# Patient Record
Sex: Female | Born: 1950 | Race: Black or African American | Hispanic: No | Marital: Married | Smoking: Never smoker
Health system: Southern US, Community
[De-identification: ages and names within clinical notes are randomized; demographics above are authoritative.]

## PROBLEM LIST (undated history)

## (undated) DIAGNOSIS — E785 Hyperlipidemia, unspecified: Secondary | ICD-10-CM

## (undated) DIAGNOSIS — M1990 Unspecified osteoarthritis, unspecified site: Secondary | ICD-10-CM

## (undated) DIAGNOSIS — G4733 Obstructive sleep apnea (adult) (pediatric): Secondary | ICD-10-CM

## (undated) DIAGNOSIS — I10 Essential (primary) hypertension: Secondary | ICD-10-CM

## (undated) HISTORY — PX: COLONOSCOPY: SHX174

## (undated) HISTORY — DX: Hyperlipidemia, unspecified: E78.5

## (undated) HISTORY — DX: Essential (primary) hypertension: I10

## (undated) HISTORY — DX: Obstructive sleep apnea (adult) (pediatric): G47.33

---

## 2001-02-09 ENCOUNTER — Encounter: Admission: RE | Admit: 2001-02-09 | Discharge: 2001-02-09 | Payer: Self-pay | Admitting: Gynecology

## 2001-02-09 ENCOUNTER — Encounter: Payer: Self-pay | Admitting: Gynecology

## 2001-10-24 ENCOUNTER — Other Ambulatory Visit: Admission: RE | Admit: 2001-10-24 | Discharge: 2001-10-24 | Payer: Self-pay | Admitting: Gynecology

## 2003-10-08 ENCOUNTER — Other Ambulatory Visit: Admission: RE | Admit: 2003-10-08 | Discharge: 2003-10-08 | Payer: Self-pay | Admitting: Gynecology

## 2005-01-12 ENCOUNTER — Other Ambulatory Visit: Admission: RE | Admit: 2005-01-12 | Discharge: 2005-01-12 | Payer: Self-pay | Admitting: Gynecology

## 2005-03-04 ENCOUNTER — Encounter: Admission: RE | Admit: 2005-03-04 | Discharge: 2005-03-04 | Payer: Self-pay | Admitting: Gynecology

## 2005-04-20 ENCOUNTER — Encounter: Admission: RE | Admit: 2005-04-20 | Discharge: 2005-04-20 | Payer: Self-pay | Admitting: Gynecology

## 2005-09-14 ENCOUNTER — Ambulatory Visit: Payer: Self-pay | Admitting: Pulmonary Disease

## 2005-09-17 ENCOUNTER — Ambulatory Visit (HOSPITAL_BASED_OUTPATIENT_CLINIC_OR_DEPARTMENT_OTHER): Admission: RE | Admit: 2005-09-17 | Discharge: 2005-09-17 | Payer: Self-pay | Admitting: Pulmonary Disease

## 2005-10-06 ENCOUNTER — Ambulatory Visit: Payer: Self-pay | Admitting: Pulmonary Disease

## 2005-10-08 ENCOUNTER — Ambulatory Visit: Payer: Self-pay | Admitting: Pulmonary Disease

## 2005-11-04 ENCOUNTER — Ambulatory Visit: Payer: Self-pay | Admitting: Pulmonary Disease

## 2006-06-20 ENCOUNTER — Other Ambulatory Visit: Admission: RE | Admit: 2006-06-20 | Discharge: 2006-06-20 | Payer: Self-pay | Admitting: Gynecology

## 2006-06-28 ENCOUNTER — Encounter: Admission: RE | Admit: 2006-06-28 | Discharge: 2006-06-28 | Payer: Self-pay | Admitting: Gynecology

## 2008-10-07 ENCOUNTER — Encounter: Admission: RE | Admit: 2008-10-07 | Discharge: 2008-10-07 | Payer: Self-pay | Admitting: Gynecology

## 2010-02-17 ENCOUNTER — Encounter
Admission: RE | Admit: 2010-02-17 | Discharge: 2010-02-17 | Payer: Self-pay | Source: Home / Self Care | Attending: Gynecology | Admitting: Gynecology

## 2010-03-22 ENCOUNTER — Encounter: Payer: Self-pay | Admitting: Gynecology

## 2010-07-17 NOTE — Procedures (Signed)
Isabel Wong, Isabel Wong NO.:  [id number]   MEDICAL RECORD NO.:  [id number]          PATIENT TYPE:  OUT   LOCATION:  SLEEP CENTER                 FACILITY:  [facility]   PHYSICIAN:  Marcelyn Bruins, M.D. [facility] DATE OF BIRTH:  [birth year]   DATE OF STUDY:  09/17/2005                              NOCTURNAL POLYSOMNOGRAM   REFERRING PHYSICIAN:  Dr. Marcelyn Bruins   INDICATIONS FOR THE STUDY:  Hypersomnia with sleep apnea.   EPWORTH SCORE:  9.   SLEEP ARCHITECTURE:  The patient had a total sleep time of only 230 minutes  with decreased REM and never achieved slow wave sleep.  Sleep onset latency  was very prolonged at 112 minutes, and REM onset was very prolonged at 227  minutes.  Sleep efficiency was very decreased at 54%.   RESPIRATORY DATA:  The patient was found to have 145 hypopneas, 62  obstructive apneas, and 3 central apneas for a respiratory disturbance index  of 55 events per hour.  Events were not positional, but there was moderate  snoring noted throughout.   OXYGEN DATA:  There was O2 desaturation as low as 84% with the patient's  obstructive events.   CARDIAC DATA:  No clinically significant cardiac arrhythmias.   MOVEMENT/PARASOMNIA:  None   IMPRESSION/RECOMMENDATIONS:  Severe obstructive sleep apnea/hypopnea  syndrome with a respiratory disturbance index of 55 events per hour and O2  desaturation as low as 84%.  Treatment for this degree of sleep apnea should  consist primarily of weight loss as well as CPAP.           ______________________________  Marcelyn Bruins, M.D. [facility]  Diplomate, American Board of Sleep  Medicine     KC/MEDQ  D:  10/01/2005 15:50:23  T:  10/01/2005 21:03:16  Job:  [id number]

## 2010-07-17 NOTE — Assessment & Plan Note (Signed)
[facility]                               PULMONARY OFFICE NOTE   [name], [name]                         MRN:          [id number]  DATE:11/04/2005                            DOB:          [birth year]    SUBJECTIVE:  Isabel Wong comes in today after initiating a CPAP at the last  visit for severe obstructive sleep apnea.  She is using a full face mask  with her CPAP pressure being 10 cm currently.  She has been wearing it every  night and has seen improvement in her sleep and also in her daytime energy  level.  Overall, she is pleased with her results.  She is slowly getting use  to her CPAP mask.  Of note, her weight is actually decreased 6 pounds since  the last visit.   PHYSICAL EXAMINATION:  GENERAL APPEARANCE:  She is a well-developed black  female in no acute distress.  VITAL SIGNS:  Blood pressure is 106/70, pulse 75, temperature 98, weight 186  pounds.  O2 saturation on room air is 99%.  There is no evidence of skin breakdown or pressure necrosis from the CPAP  mask.   IMPRESSION:  Severe obstructive sleep apnea which is being treated with CPAP  currently.  Patient seems to be doing well and is gradually adjusting to the  machine. At this point in time we need to optimize the pressure for her and  will arrange for an auto titrate device for two weeks with appropriate  download.   PLAN:  1. I have asked the patient to continue working on weight loss.  2. Will do auto titrate device for two weeks and optimize her CPAP      pressure.  3. If everything is going well, patient will follow up in six months or      sooner if there are problems.                                   Isabel Share, MD,FCCP   [facility]/MedQ  DD:  11/04/2005  DT:  11/04/2005  Job #:  [id number]   cc:   Luanna Cole. Lenord Fellers, M.D.

## 2010-07-17 NOTE — Assessment & Plan Note (Signed)
[facility]                               PULMONARY OFFICE NOTE   NEMA, OATLEY                         MRN:          [id number]  DATE:10/08/2005                            DOB:          Aug 08, 1950    SUBJECTIVE:  Isabel Wong comes in today after her recent nocturnal  polysomnogram.  She was found to have severe obstructive sleep apnea with a  respiratory disturbance index of 55 events per hour and O2 desaturation as  low as 84%.  I have gone over the study in great detail with her and have  answered all questions.   PHYSICAL EXAMINATION:  GENERAL:  She is an overweight black female, in no  acute distress.  VITAL SIGNS:  Blood pressure 116/74, pulse 69, temperature 97.9 degrees.  Weight 192 pounds.  Saturation on room air is 97%.   IMPRESSION:  Severe obstructive sleep apnea, documented by nocturnal  polysomnography.  The patient is extremely symptomatic and has been unable  to keep her weight down.   PLAN:  At this point in time her best treatment options are CPAP coupled  with weight loss.  She is agreeable to trying this.  1. Initiate CPAP at 10 cm with humidification and mask of choice.  2. At some point in time the patient will need an auto titrate device at      home, to optimize her pressure for her.  3. Work on weight loss.  4. Follow up in four weeks or sooner if any problems.  The patient knows      to call me if she has difficulties with the CPAP compliance, so that we      can trouble shoot the device prior to her next appointment.                                   Barbaraann Share, MD, FCCP   [facility]/MedQ  DD:  10/08/2005  DT:  10/08/2005  Job #:  [id number]   cc:   Isabel Wong. Lenord Fellers, MD

## 2010-07-17 NOTE — Assessment & Plan Note (Signed)
[facility]                               PULMONARY OFFICE NOTE   Isabel Wong, Isabel Wong                         MRN:          [id number]  DATE:09/14/2005                            DOB:          1950-04-03    SLEEP MEDICINE SELF-REFERRAL   DATE OF EVALUATION:  September 14, 2005.   HISTORY OF PRESENT ILLNESS:  The patient is a 60 year old female, who comes  in today for evaluation of possible sleep apnea.  The patient states that  has been noted to have loud snoring as well as gasping episodes and pauses  in her breathing during sleep.  She typically goes to bed between 10 or 12  at night and gets up between 7:30 and 8:30 in the morning to start the day.  She is tired whenever she arises.  She notes significant excessive daytime  sleepiness with periods of inactivity and will get sleepy with long  distances and on rare occasions at stop lights.  She will fall asleep very  easily watching TV or movies.  She feels that this has had a very large  impact on her quality of life.  Of note, the patient has gained  approximately 20 pounds over the last two years.   PAST MEDICAL HISTORY:  Hypertension, otherwise noncontributory.   CURRENT MEDICATIONS:  Benicar 40/25 daily.   The patient has no known drug allergies.   SOCIAL HISTORY:  She has never smoked, she is married and has children.   FAMILY HISTORY:  Remarkable for heart disease, otherwise is noncontributory.   REVIEW OF SYSTEMS:  As per History of Present Illness.  Also, see patient's  intake form documented in the chart.   PHYSICAL EXAMINATION:  GENERAL:  She is an overweight female in no acute  distress.  VITAL SIGNS:  Blood pressure is 102/70, pulse 94, temperature is 98.4,  weight is 185 pounds, O2 saturation on room air is 99%.  HEENT:  Pupils are equal, round, and reactive to light and accommodation.  Extraocular muscles are intact.  The nares show septal deviation to the left  with near  total obstruction.  The oropharynx shows mild elongation of the  soft palate and uvula.  NECK:  Supple without JVD or lymphadenopathy.  There is no palpable  thyromegaly.  CHEST:  Totally clear.  CARDIAC:  Regular rate and rhythm.  No murmurs, rubs, or gallops.  ABDOMEN:  Soft and nontender with good bowel sounds.  GENITALIA, RECTAL, BREASTS:  Examinations not done and not indicated.  LOWER EXTREMITIES:  Without edema.  Pulses are intact distally.  NEUROLOGIC:  She is alert and oriented with no observable motor defect.   IMPRESSION:  Probable obstructive sleep apnea.  The patient gives a very  good history for this and is clearly very symptomatic during the day.  I had  a long discussion with her about sleep apnea, and feel that a sleep study is  indicated at this time.  The patient is agreeable to this.   PLAN:  1.  Schedule for a nocturnal polysomnogram.  2.  Work on weight loss.  3.  The patient will follow up after the above.                                   Barbaraann Share, MD, FCCP   [facility]/MedQ  DD:  10/03/2005  DT:  10/03/2005  Job #:  [id number]   cc:   Luanna Cole. Lenord Fellers, MD

## 2011-01-14 ENCOUNTER — Other Ambulatory Visit: Payer: Self-pay | Admitting: Gynecology

## 2011-01-14 DIAGNOSIS — Z1231 Encounter for screening mammogram for malignant neoplasm of breast: Secondary | ICD-10-CM

## 2011-02-19 ENCOUNTER — Ambulatory Visit: Payer: Self-pay

## 2012-08-15 ENCOUNTER — Other Ambulatory Visit: Payer: Self-pay

## 2013-09-25 ENCOUNTER — Other Ambulatory Visit: Payer: Self-pay | Admitting: Obstetrics & Gynecology

## 2013-09-25 DIAGNOSIS — N644 Mastodynia: Secondary | ICD-10-CM

## 2013-09-28 ENCOUNTER — Other Ambulatory Visit: Payer: Self-pay

## 2013-10-05 ENCOUNTER — Other Ambulatory Visit: Payer: Self-pay

## 2013-10-10 ENCOUNTER — Other Ambulatory Visit: Payer: Self-pay

## 2014-09-20 ENCOUNTER — Ambulatory Visit
Admission: RE | Admit: 2014-09-20 | Discharge: 2014-09-20 | Disposition: A | Discharge status: Home / Self Care | Payer: BLUE CROSS/BLUE SHIELD | Source: Ambulatory Visit | Attending: Obstetrics & Gynecology | Admitting: Obstetrics & Gynecology

## 2014-09-20 DIAGNOSIS — N644 Mastodynia: Secondary | ICD-10-CM

## 2015-11-19 ENCOUNTER — Other Ambulatory Visit: Payer: Self-pay | Admitting: Obstetrics & Gynecology

## 2015-11-19 DIAGNOSIS — Z1231 Encounter for screening mammogram for malignant neoplasm of breast: Secondary | ICD-10-CM

## 2015-11-27 ENCOUNTER — Ambulatory Visit: Payer: BLUE CROSS/BLUE SHIELD

## 2015-11-28 ENCOUNTER — Ambulatory Visit: Payer: BLUE CROSS/BLUE SHIELD

## 2015-12-09 ENCOUNTER — Ambulatory Visit
Admission: RE | Admit: 2015-12-09 | Discharge: 2015-12-09 | Disposition: A | Discharge status: Home / Self Care | Payer: 59 | Source: Ambulatory Visit | Attending: Obstetrics & Gynecology | Admitting: Obstetrics & Gynecology

## 2015-12-09 DIAGNOSIS — Z1231 Encounter for screening mammogram for malignant neoplasm of breast: Secondary | ICD-10-CM

## 2016-07-07 ENCOUNTER — Other Ambulatory Visit: Payer: Self-pay

## 2016-07-11 NOTE — Progress Notes (Signed)
Cardiology Office Note   Date:  07/13/2016   ID:  Isabel ManeRita D Korver, DOB [birth year], MRN [id number]  PCP:  Deatra JamesSun, Vyvyan, MD  Cardiologist:   Rollene RotundaJames Taneka Espiritu, MD  Referring:  Deatra JamesSun, Vyvyan, MD  Chief Complaint  Patient presents with  . Palpitations  . Shortness of Breath      History of Present Illness: Isabel Wong is a 66 y.o. female who is referred by Deatra JamesSun, Vyvyan, MD for evaluation of difficult to control BPs, palpitations and SOB.  She did have a stress test years ago but otherwise has no past cardiac history. She's had hypertension for 13 years. This has been difficult. He was okay when she was taking branded olemsartan and hydrochlorothiazide. However, when she had to switch to generic she developed facial swelling and hypertension.    She then was switched to generic but she developed rash and facial swelling. She then was switched only hydrochlorothiazide for blood pressure is well-controlled. Sotalol caused facial swelling. She was on chlorthalidone for a while headache. She is now back on Dyazide.  The patient denies any new symptoms such as chest discomfort, neck or arm discomfort. There has been no new PND or orthopnea. There has been no presyncope or syncope.  She does have DOE doing such things as walking a flight of stairs.   She also has had some palpitations while trying different meds.   Past Medical History:  Diagnosis Date  . Hyperlipidemia   . Hypertension   . OSA (obstructive sleep apnea)    CPAP nasal    Past Surgical History:  Procedure Laterality Date  . None       Current Outpatient Prescriptions  Medication Sig Dispense Refill  . CALCIUM-MAGNESIUM-VITAMIN D PO Take 5-10 mLs by mouth daily.    . Flaxseed, Linseed, (FLAX SEEDS) POWD Take by mouth daily.    . Glucosamine HCl (GLUCOSAMINE PO) Take 1 tablet by mouth daily as needed.    Marland Kitchen. guaiFENesin (MUCINEX) 600 MG 12 hr tablet Take 600 mg by mouth 2 (two) times daily as needed.    . MULTIPLE VITAMIN PO  Take 1 tablet by mouth daily.    . Omega-3 Fatty Acids (OMEGA 3 PO) Take 3 capsules by mouth daily.    Marland Kitchen. VIT C-QUERCET-BIOFLV-BROMELAIN PO Take 1 tablet by mouth daily as needed.    Marland Kitchen. spironolactone (ALDACTONE) 25 MG tablet Take 1 tablet (25 mg total) by mouth daily. 30 tablet 3   No current facility-administered medications for this visit.     Allergies:   Chlorthalidone; Benicar [olmesartan]; Hydrochlorothiazide; and Metoprolol    Social History:  The patient  reports that she has never smoked. She has never used smokeless tobacco.   Family History:  The patient's family history includes Breast cancer in her sister; Heart disease in her brother; Hypertension in her father, sister, and sister.   Both parents died young in car accident.     ROS:  Please see the history of present illness.   Otherwise, review of systems are positive for none.   All other systems are reviewed and negative.    PHYSICAL EXAM: VS:  BP 136/90   Pulse 83   Ht 5\' 3"  (1.6 m)   Wt 161 lb 3.2 oz (73.1 kg)   BMI 28.56 kg/m  , BMI Body mass index is 28.56 kg/m. GENERAL:  Well appearing HEENT:  Pupils equal round and reactive, fundi not visualized, oral mucosa unremarkable NECK:  No jugular venous distention,  waveform within normal limits, carotid upstroke brisk and symmetric, no bruits, no thyromegaly LYMPHATICS:  No cervical, inguinal adenopathy LUNGS:  Clear to auscultation bilaterally BACK:  No CVA tenderness CHEST:  Unremarkable HEART:  PMI not displaced or sustained,S1 and S2 within normal limits, no S3, no S4, no clicks, no rubs, no murmurs ABD:  Flat, positive bowel sounds normal in frequency in pitch, no bruits, no rebound, no guarding, no midline pulsatile mass, no hepatomegaly, no splenomegaly EXT:  2 plus pulses throughout, no edema, no cyanosis no clubbing SKIN:  No rashes no nodules NEURO:  Cranial nerves II through XII grossly intact, motor grossly intact throughout PSYCH:  Cognitively intact,  oriented to person place and time    EKG:  EKG is ordered today. The ekg ordered today demonstrates sinus rhythm, rate 90, axis within normal limits, intervals within normal limits, no acute ST-T wave changes   Recent Labs: No results found for requested labs within last 8760 hours.    Lipid Panel No results found for: CHOL, TRIG, HDL, CHOLHDL, VLDL, LDLCALC, LDLDIRECT    Wt Readings from Last 3 Encounters:  07/12/16 161 lb 3.2 oz (73.1 kg)      Other studies Reviewed: Additional studies/ records that were reviewed today include: Labs and office records. Review of the above records demonstrates:  Please see elsewhere in the note.     ASSESSMENT AND PLAN:  HTN:  This has been difficult to control. I am going to suggest that she start spironolactone 25 mg daily and we will check a BMET in 10 days.  She will keep a BP diary.   PALPITATIONS:  She had transient problems with this on one of the meds.  She is not clear which one.  No further imaging is indicated.    Current medicines are reviewed at length with the patient today.  The patient does not have concerns regarding medicines.  The following changes have been made:  As above  Labs/ tests ordered today include:   Orders Placed This Encounter  Procedures  . Basic Metabolic Panel (BMET)  . EKG 12-Lead     Disposition:   FU with me in one month.     Signed, Rollene Rotunda, MD  07/13/2016 11:38 AM    [facility]

## 2016-07-12 ENCOUNTER — Ambulatory Visit (INDEPENDENT_AMBULATORY_CARE_PROVIDER_SITE_OTHER): Payer: 59 | Admitting: Cardiology

## 2016-07-12 ENCOUNTER — Encounter: Payer: Self-pay | Admitting: Cardiology

## 2016-07-12 VITALS — BP 136/90 | HR 83 | Ht 63.0 in | Wt 161.2 lb

## 2016-07-12 DIAGNOSIS — Z79899 Other long term (current) drug therapy: Secondary | ICD-10-CM

## 2016-07-12 DIAGNOSIS — R002 Palpitations: Secondary | ICD-10-CM | POA: Diagnosis not present

## 2016-07-12 DIAGNOSIS — R0602 Shortness of breath: Secondary | ICD-10-CM

## 2016-07-12 DIAGNOSIS — I10 Essential (primary) hypertension: Secondary | ICD-10-CM

## 2016-07-12 MED ORDER — SPIRONOLACTONE 25 MG PO TABS: 25.0000 mg | ORAL_TABLET | Freq: Every day | ORAL | 3 refills | Status: DC

## 2016-07-12 NOTE — Patient Instructions (Addendum)
Medication Instructions:   START taking spironolactone 25mg  DAILY  STOP hydrochlorothiazide (HCTZ)  Labwork:   BMET in 10 days  Testing/Procedures:  none  Follow-Up:  1 month with Dr. Antoine PocheHochrein   If you need a refill on your cardiac medications before your next appointment, please call your pharmacy.

## 2016-07-13 ENCOUNTER — Encounter: Payer: Self-pay | Admitting: Cardiology

## 2016-07-13 DIAGNOSIS — I10 Essential (primary) hypertension: Secondary | ICD-10-CM | POA: Insufficient documentation

## 2016-07-13 DIAGNOSIS — R0602 Shortness of breath: Secondary | ICD-10-CM | POA: Insufficient documentation

## 2016-07-13 DIAGNOSIS — R002 Palpitations: Secondary | ICD-10-CM | POA: Insufficient documentation

## 2016-07-13 HISTORY — DX: Shortness of breath: R06.02

## 2016-07-13 HISTORY — DX: Palpitations: R00.2

## 2016-07-27 ENCOUNTER — Encounter (HOSPITAL_COMMUNITY): Admission: RE | Payer: Self-pay | Source: Ambulatory Visit

## 2016-07-27 ENCOUNTER — Inpatient Hospital Stay (HOSPITAL_COMMUNITY): Admission: RE | Admit: 2016-07-27 | Payer: 59 | Source: Ambulatory Visit | Admitting: Orthopedic Surgery

## 2016-07-27 SURGERY — ARTHROPLASTY, HIP, TOTAL, ANTERIOR APPROACH
Anesthesia: Spinal | Site: Hip | Laterality: Left

## 2016-07-30 LAB — BASIC METABOLIC PANEL
BUN: 10 mg/dL (ref 7–25)
CHLORIDE: 106 mmol/L (ref 98–110)
CO2: 25 mmol/L (ref 20–31)
Calcium: 10.1 mg/dL (ref 8.6–10.4)
Creat: 0.84 mg/dL (ref 0.50–0.99)
GLUCOSE: 78 mg/dL (ref 65–99)
POTASSIUM: 4.5 mmol/L (ref 3.5–5.3)
SODIUM: 141 mmol/L (ref 135–146)

## 2016-08-08 NOTE — Progress Notes (Signed)
Cardiology Office Note   Date:  08/09/2016   ID:  Isabel ManeRita D Goytia, DOB 12/02/1950, MRN [id number]  PCP:  Deatra JamesSun, Vyvyan, MD  Cardiologist:   Rollene RotundaJames Daequan Kozma, MD  Referring:  Deatra JamesSun, Vyvyan, MD  Chief Complaint  Patient presents with  . Hypertension      History of Present Illness: Isabel Wong is a 66 y.o. female who is referred by Deatra JamesSun, Vyvyan, MD for evaluation of difficult to control BPs.  At our first appt I started spironolactone.  Her blood pressures been well controlled.  I reviewed the BP diary.   She's not yet started exercising that she's had some joint problems. She did well with medication. The patient denies any new symptoms such as chest discomfort, neck or arm discomfort. There has been no new shortness of breath, PND or orthopnea. There have been no reported palpitations, presyncope or syncope.   Past Medical History:  Diagnosis Date  . Hyperlipidemia   . Hypertension   . OSA (obstructive sleep apnea)    CPAP nasal    Past Surgical History:  Procedure Laterality Date  . None       Current Outpatient Prescriptions  Medication Sig Dispense Refill  . CALCIUM-MAGNESIUM-VITAMIN D PO Take 5-10 mLs by mouth daily.    . Flaxseed, Linseed, (FLAX SEEDS) POWD Take by mouth daily.    . Glucosamine HCl (GLUCOSAMINE PO) Take 1 tablet by mouth daily as needed.    Marland Kitchen. guaiFENesin (MUCINEX) 600 MG 12 hr tablet Take 600 mg by mouth 2 (two) times daily as needed.    . MULTIPLE VITAMIN PO Take 1 tablet by mouth daily.    . Omega-3 Fatty Acids (OMEGA 3 PO) Take 3 capsules by mouth daily.    Marland Kitchen. spironolactone (ALDACTONE) 25 MG tablet Take 1 tablet (25 mg total) by mouth daily. 30 tablet 3  . VIT C-QUERCET-BIOFLV-BROMELAIN PO Take 1 tablet by mouth daily as needed.     No current facility-administered medications for this visit.     Allergies:   Chlorthalidone; Benicar [olmesartan]; Hydrochlorothiazide; and Metoprolol    ROS:  Please see the history of present illness.   Otherwise,  review of systems are positive for none.   All other systems are reviewed and negative.    PHYSICAL EXAM: VS:  BP 110/74   Pulse (!) 56   Ht 5\' 3"  (1.6 m)   Wt 163 lb (73.9 kg)   BMI 28.87 kg/m  , BMI Body mass index is 28.87 kg/m.  GENERAL:  Well appearing NECK:  No jugular venous distention, waveform within normal limits, carotid upstroke brisk and symmetric, no bruits, no thyromegaly LUNGS:  Clear to auscultation bilaterally BACK:  No CVA tenderness CHEST:  Unremarkable HEART:  PMI not displaced or sustained,S1 and S2 within normal limits, no S3, no S4, no clicks, no rubs, no murmurs ABD:  Flat, positive bowel sounds normal in frequency in pitch, no bruits, no rebound, no guarding, no midline pulsatile mass, no hepatomegaly, no splenomegaly EXT:  2 plus pulses throughout, no edema, no cyanosis no clubbing    EKG:  EKG is not  ordered today.   Recent Labs: 07/29/2016: BUN 10; Creat 0.84; Potassium 4.5; Sodium 141    Lipid Panel No results found for: CHOL, TRIG, HDL, CHOLHDL, VLDL, LDLCALC, LDLDIRECT    Wt Readings from Last 3 Encounters:  08/09/16 163 lb (73.9 kg)  07/12/16 161 lb 3.2 oz (73.1 kg)      Other studies Reviewed: Additional  studies/ records that were reviewed today include: Office labs Review of the above records demonstrates:    ASSESSMENT AND PLAN:  HTN:  The blood pressure is at target. No change in medications is indicated. We will continue with therapeutic lifestyle changes (TLC). She will continue current meds and have potassium followed by her PCP.  The BMET was normal two weeks after the spiro started.   PALPITATIONS:   She is not feeling these  RISK REDUCTION:  Her 10 year cardiovascular risk is 7.4%. Wants to hold off on taking an aspirin. Her LDL was 124 with an HDL of 56. She likely diet for cholesterol. These are all reasonable choices.    Current medicines are reviewed at length with the patient today.  The patient does not have  concerns regarding medicines.  The following changes have been made:  None  Labs/ tests ordered today include:   Non3  No orders of the defined types were placed in this encounter.    Disposition:   FU with me in 12 months.     Signed, Rollene Rotunda, MD  08/09/2016 8:40 AM    [facility]

## 2016-08-09 ENCOUNTER — Encounter (INDEPENDENT_AMBULATORY_CARE_PROVIDER_SITE_OTHER): Payer: Self-pay

## 2016-08-09 ENCOUNTER — Ambulatory Visit (INDEPENDENT_AMBULATORY_CARE_PROVIDER_SITE_OTHER): Payer: 59 | Admitting: Cardiology

## 2016-08-09 ENCOUNTER — Encounter: Payer: Self-pay | Admitting: Cardiology

## 2016-08-09 VITALS — BP 110/74 | HR 56 | Ht 63.0 in | Wt 163.0 lb

## 2016-08-09 DIAGNOSIS — I10 Essential (primary) hypertension: Secondary | ICD-10-CM

## 2016-08-09 DIAGNOSIS — E785 Hyperlipidemia, unspecified: Secondary | ICD-10-CM | POA: Diagnosis not present

## 2016-08-09 NOTE — Patient Instructions (Signed)

## 2016-09-02 ENCOUNTER — Other Ambulatory Visit: Payer: Self-pay

## 2016-09-02 MED ORDER — SPIRONOLACTONE 25 MG PO TABS: 25.0000 mg | ORAL_TABLET | Freq: Every day | ORAL | 3 refills | Status: DC

## 2016-09-02 NOTE — Telephone Encounter (Signed)
Rx(s) sent to pharmacy electronically.  

## 2017-03-14 ENCOUNTER — Telehealth: Payer: Self-pay | Admitting: Cardiology

## 2017-03-14 NOTE — Telephone Encounter (Signed)
Spoke with patient and she has been having left sided chest pain off and on for the last couple of weeks. Pain is worse when she raises her arm or with movement. Can feel tenderness when she presses area. Denies any shortness of breath or radiating pain. Advised patient did not sound cardiac and to follow up with PCP

## 2017-03-14 NOTE — Telephone Encounter (Signed)
Left message to call back  

## 2017-03-14 NOTE — Telephone Encounter (Signed)
Pt c/o of Chest Pain: STAT if CP now or developed within 24 hours  1. Are you having CP right now?  no  2. Are you experiencing any other symptoms (ex. SOB, nausea, vomiting, sweating)? no  3. How long have you been experiencing CP? 2 weeks  4. Is your CP continuous or coming and going? Coming and going   5. Have you taken Nitroglycerin? no ?

## 2017-03-14 NOTE — Telephone Encounter (Signed)
Agree 

## 2017-07-07 ENCOUNTER — Telehealth: Payer: Self-pay

## 2017-07-07 NOTE — Telephone Encounter (Signed)
   [facility] Pre-operative Risk Assessment    Request for surgical clearance:  1. What type of surgery is being performed? Left hip THA w/wo autograft/allograft   2. When is this surgery scheduled? 08/16/17   3. What type of clearance is required (medical clearance vs. Pharmacy clearance to hold med vs. Both)? Medical  4. Are there any medications that need to be held prior to surgery and how long?n/a   5. Practice name and name of physician performing surgery? [facility]   6. What is your office phone number [phone]    7.   What is your office fax number [phone] Derl Barrow  8.   Anesthesia type (None, local, MAC, general) ?  Spinal   Meryl Crutch 07/07/2017, 5:17 PM  _________________________________________________________________   (provider comments below)

## 2017-07-08 NOTE — Telephone Encounter (Signed)
   Primary Cardiologist:James Hochrein, MD  Chart reviewed as part of pre-operative protocol coverage. Because of Isabel Wong's past medical history and time since last visit, he/she will require a follow-up visit in order to better assess preoperative cardiovascular risk. Last OV 08/09/16 -> will be overdue for 1 year follow-up by the time surgery is scheduled. Please arrange f/u appt closer to time of surgery, such as early June - can be with APP.  Pre-op covering staff: - Please schedule appointment and call patient to inform them. - Please contact requesting surgeon's office via preferred method (i.e, phone, fax) to inform them of need for appointment prior to surgery.  Laurann Montana, PA-C  07/08/2017, 4:13 PM

## 2017-07-08 NOTE — Telephone Encounter (Signed)
Left message to call back to schedule OV for clearance. Left message for Isabel Wong at [facility] updating that patient will need OV for clearance

## 2017-07-11 NOTE — Telephone Encounter (Signed)
Left detailed message for pt to call the office and get an appt before her surgical clearance could be completed.

## 2017-07-12 NOTE — Telephone Encounter (Signed)
Pt called back and has been scheduled for her Pre Op clearance appt with Dr. Antoine Poche. Pt thanked me for the call .

## 2017-07-12 NOTE — Telephone Encounter (Signed)
Lmtcb to schedule appt for surgery clearance .

## 2017-07-12 NOTE — Telephone Encounter (Signed)
3rd attempt.. I will take out of the call back pool. I called and lmom for the surgeons office to inform we have attempted to reach the pt x 3 to schedule appt for pre op clearance.

## 2017-07-13 ENCOUNTER — Encounter: Payer: Self-pay | Admitting: Cardiology

## 2017-07-14 NOTE — Progress Notes (Signed)
Cardiology Office Note   Date:  07/15/2017   ID:  Isabel Wong, DOB 04/20/1950, MRN [id number]  PCP:  Deatra Babatunde Seago, MD  Cardiologist:   Rollene Rotunda, MD  Referring:  Deatra Chayanne Filippi, MD  Chief Complaint  Patient presents with  . Follow-up      History of Present Illness: Isabel Wong is a 67 y.o. female who is referred by Deatra Brevon Dewald, MD for evaluation of difficult to control BPs.   Since I last saw her she is done well from a cardiovascular standpoint.  Blood pressures have been very well controlled.  She denies any cardiovascular symptoms.  She is limited by left hip pain and needs left hip replacement. The patient denies any new symptoms such as chest discomfort, neck or arm discomfort. There has been no new shortness of breath, PND or orthopnea. There have been no reported palpitations, presyncope or syncope.   She can still climb stairs and do her household chores.  However, she has to hold onto a cart to get around the grocery store.    At our first appt I started spironolactone.  Her blood pressures been well controlled.  I reviewed the BP diary.   She's not yet started exercising that she's had some joint problems. She did well with medication. The patient denies any new symptoms such as chest discomfort, neck or arm discomfort. There has been no new shortness of breath, PND or orthopnea. There have been no reported palpitations, presyncope or syncope.   Past Medical History:  Diagnosis Date  . Hyperlipidemia   . Hypertension   . OSA (obstructive sleep apnea)    CPAP nasal    Past Surgical History:  Procedure Laterality Date  . None       Current Outpatient Medications  Medication Sig Dispense Refill  . CALCIUM-MAGNESIUM-VITAMIN D PO Take 5-10 mLs by mouth daily.    . Flaxseed, Linseed, (FLAX SEEDS) POWD Take by mouth daily.    . Glucosamine HCl (GLUCOSAMINE PO) Take 1 tablet by mouth daily as needed.    Marland Kitchen guaiFENesin (MUCINEX) 600 MG 12 hr tablet Take 600 mg by  mouth 2 (two) times daily as needed.    . MULTIPLE VITAMIN PO Take 1 tablet by mouth daily.    . Omega-3 Fatty Acids (OMEGA 3 PO) Take 3 capsules by mouth daily.    Marland Kitchen VIT C-QUERCET-BIOFLV-BROMELAIN PO Take 1 tablet by mouth daily as needed.    Marland Kitchen spironolactone (ALDACTONE) 25 MG tablet Take 1 tablet (25 mg total) by mouth daily. 90 tablet 3   No current facility-administered medications for this visit.     Allergies:   Chlorthalidone; Benicar [olmesartan]; Hydrochlorothiazide; and Metoprolol    ROS:  Please see the history of present illness.   Otherwise, review of systems are positive for none.   All other systems are reviewed and negative.    PHYSICAL EXAM: VS:  BP 122/84 (BP Location: Left Arm, Patient Position: Sitting, Cuff Size: Normal)   Pulse 84   Ht  (1.6 m)   Wt 172 lb (78 kg)   BMI 30.47 kg/m  , BMI Body mass index is 30.47 kg/m.  GENERAL:  Well appearing NECK:  No jugular venous distention, waveform within normal limits, carotid upstroke brisk and symmetric, no bruits, no thyromegaly LUNGS:  Clear to auscultation bilaterally CHEST:  Unremarkable HEART:  PMI not displaced or sustained,S1 and S2 within normal limits, no S3, no S4, no clicks, no rubs, no murmurs  ABD:  Flat, positive bowel sounds normal in frequency in pitch, no bruits, no rebound, no guarding, no midline pulsatile mass, no hepatomegaly, no splenomegaly EXT:  2 plus pulses throughout, no edema, no cyanosis no clubbing   EKG:  EKG is not ordered today.   Recent Labs: 07/29/2016: BUN 10; Creat 0.84; Potassium 4.5; Sodium 141    Lipid Panel No results found for: CHOL, TRIG, HDL, CHOLHDL, VLDL, LDLCALC, LDLDIRECT    Wt Readings from Last 3 Encounters:  07/15/17 172 lb (78 kg)  08/09/16 163 lb (73.9 kg)  07/12/16 161 lb 3.2 oz (73.1 kg)      Other studies Reviewed: Additional studies/ records that were reviewed today include: None Review of the above records demonstrates:     ASSESSMENT  AND PLAN:  HTN:  The blood pressure is at target.  I will follow up labs to include CBC, BMET and lipids.  No change in therapy.   RISK REDUCTION:    I will check a lipid profile.  Previously her 10 year cardiovascular risk is 7.4%.  She is not on statin.  One was not indicated.  We will follow up this year.   PREOP:     The patient is at acceptable risk for the planned procedure.  There are no high risk findings.  It is not a high risk procedure.  No further testing is indicated according to ACC/AHA guidelines.    Current medicines are reviewed at length with the patient today.  The patient does not have concerns regarding medicines.  The following changes have been made:  None  Labs/ tests ordered today include:     Orders Placed This Encounter  Procedures  . Comprehensive metabolic panel  . Lipid panel  . CBC  . EKG 12-Lead     Disposition:   FU with me in 12 months.     Signed, Rollene Rotunda, MD  07/15/2017 5:15 PM    [facility]

## 2017-07-15 ENCOUNTER — Encounter: Payer: Self-pay | Admitting: Cardiology

## 2017-07-15 ENCOUNTER — Ambulatory Visit: Payer: 59 | Admitting: Cardiology

## 2017-07-15 VITALS — BP 122/84 | HR 84 | Ht 63.0 in | Wt 172.0 lb

## 2017-07-15 DIAGNOSIS — I10 Essential (primary) hypertension: Secondary | ICD-10-CM

## 2017-07-15 DIAGNOSIS — Z01810 Encounter for preprocedural cardiovascular examination: Secondary | ICD-10-CM

## 2017-07-15 DIAGNOSIS — Z13220 Encounter for screening for lipoid disorders: Secondary | ICD-10-CM

## 2017-07-15 DIAGNOSIS — Z79899 Other long term (current) drug therapy: Secondary | ICD-10-CM | POA: Diagnosis not present

## 2017-07-15 NOTE — Patient Instructions (Signed)
Dr Antoine Poche recommends that you continue on your current medications as directed. Please refer to the Current Medication list given to you today.  Your physician recommends that you return for lab work at your earliest convenience - FASTING.  Dr Royann Shivers recommends that you schedule a follow-up appointment in 12 months. You will receive a reminder letter in the mail two months in advance. If you don't receive a letter, please call our office to schedule the follow-up appointment.  If you need a refill on your cardiac medications before your next appointment, please call your pharmacy.

## 2017-07-16 ENCOUNTER — Encounter: Payer: Self-pay | Admitting: Cardiology

## 2017-07-20 LAB — CBC
HEMATOCRIT: 39.3 % (ref 34.0–46.6)
Hemoglobin: 12.8 g/dL (ref 11.1–15.9)
MCH: 30 pg (ref 26.6–33.0)
MCHC: 32.6 g/dL (ref 31.5–35.7)
MCV: 92 fL (ref 79–97)
Platelets: 226 10*3/uL (ref 150–450)
RBC: 4.26 x10E6/uL (ref 3.77–5.28)
RDW: 14.4 % (ref 12.3–15.4)
WBC: 5 10*3/uL (ref 3.4–10.8)

## 2017-07-20 LAB — COMPREHENSIVE METABOLIC PANEL
A/G RATIO: 1.7 (ref 1.2–2.2)
ALK PHOS: 66 IU/L (ref 39–117)
ALT: 15 IU/L (ref 0–32)
AST: 16 IU/L (ref 0–40)
Albumin: 4.5 g/dL (ref 3.6–4.8)
BILIRUBIN TOTAL: 0.5 mg/dL (ref 0.0–1.2)
BUN/Creatinine Ratio: 14 (ref 12–28)
BUN: 10 mg/dL (ref 8–27)
CHLORIDE: 106 mmol/L (ref 96–106)
CO2: 23 mmol/L (ref 20–29)
Calcium: 10.1 mg/dL (ref 8.7–10.3)
Creatinine, Ser: 0.73 mg/dL (ref 0.57–1.00)
GFR calc Af Amer: 100 mL/min/{1.73_m2} (ref >59)
GFR calc non Af Amer: 87 mL/min/{1.73_m2} (ref >59)
GLOBULIN, TOTAL: 2.7 g/dL (ref 1.5–4.5)
Glucose: 89 mg/dL (ref 65–99)
POTASSIUM: 5 mmol/L (ref 3.5–5.2)
SODIUM: 142 mmol/L (ref 134–144)
Total Protein: 7.2 g/dL (ref 6.0–8.5)

## 2017-07-20 LAB — LIPID PANEL
Chol/HDL Ratio: 3.3 ratio (ref 0.0–4.4)
Cholesterol, Total: 177 mg/dL (ref 100–199)
HDL: 53 mg/dL (ref >39)
LDL CALC: 113 mg/dL — AB (ref 0–99)
TRIGLYCERIDES: 54 mg/dL (ref 0–149)
VLDL Cholesterol Cal: 11 mg/dL (ref 5–40)

## 2017-07-21 ENCOUNTER — Telehealth: Payer: Self-pay | Admitting: *Deleted

## 2017-07-21 DIAGNOSIS — E785 Hyperlipidemia, unspecified: Secondary | ICD-10-CM

## 2017-07-21 NOTE — Telephone Encounter (Signed)
-----   Message from Rollene Rotunda, MD sent at 07/21/2017  2:34 PM EDT ----- LDL is slightly elevated.   HDL is OK.   She could take a statin but also could try to improve her diet and repeat Lipid in six weeks. Call Ms. Pittinger with the results and send results to Deatra James, MD

## 2017-07-21 NOTE — Telephone Encounter (Signed)
Pt aware of her blood work, lab slip printed and mail to pt, pt is will to try dieting and exercise before medication

## 2017-08-08 NOTE — H&P (Signed)
TOTAL HIP ADMISSION H&P  Patient is admitted for left total hip arthroplasty, anterior approach.  Subjective:  Chief Complaint:     Left hip primary OA / pain  HPI: Isabel Wong, 67 y.o. female, has a history of pain and functional disability in the left hip(s) due to arthritis and patient has failed non-surgical conservative treatments for greater than 12 weeks to include NSAID's and/or analgesics and activity modification.  Onset of symptoms was gradual starting ~3 years ago with gradually worsening course since that time.The patient noted no past surgery on the left hip(s).  Patient currently rates pain in the left hip at 10 out of 10 with activity. Patient has night pain, worsening of pain with activity and weight bearing, trendelenberg gait, pain that interfers with activities of daily living and pain with passive range of motion. Patient has evidence of periarticular osteophytes and joint space narrowing by imaging studies. This condition presents safety issues increasing the risk of falls.  There is no current active infection.  Risks, benefits and expectations were discussed with the patient.  Risks including but not limited to the risk of anesthesia, blood clots, nerve damage, blood vessel damage, failure of the prosthesis, infection and up to and including death.  Patient understand the risks, benefits and expectations and wishes to proceed with surgery.   PCP: Deatra JamesSun, Vyvyan, MD  D/C Plans:       Home  Post-op Meds:       No Rx given  Tranexamic Acid:      To be given - IV   Decadron:      Is to be given  FYI:      ASA  Norco  CPAP  DME:   Rx given for - RW   PT:   OPPT Rx given   Patient Active Problem List   Diagnosis Date Noted  . Hypertension 07/13/2016  . Palpitations 07/13/2016  . SOB (shortness of breath) 07/13/2016   Past Medical History:  Diagnosis Date  . Hyperlipidemia   . Hypertension   . OSA (obstructive sleep apnea)    CPAP nasal    Past Surgical  History:  Procedure Laterality Date  . None      No current facility-administered medications for this encounter.    Current Outpatient Medications  Medication Sig Dispense Refill Last Dose  . CALCIUM-MAGNESIUM-VITAMIN D PO Take 5 mLs by mouth daily.    Taking  . Flaxseed, Linseed, (FLAX SEEDS) POWD Take 1 tablet by mouth daily.    Taking  . Glucosamine HCl (GLUCOSAMINE PO) Take 1 tablet by mouth daily as needed (for joints).    Taking  . guaiFENesin (MUCINEX) 600 MG 12 hr tablet Take 600 mg by mouth 2 (two) times daily as needed for cough or to loosen phlegm.    Taking  . MULTIPLE VITAMIN PO Take 1 tablet by mouth daily.   Taking  . Omega-3 Fatty Acids (OMEGA 3 PO) Take 3 capsules by mouth daily.   Taking  . spironolactone (ALDACTONE) 25 MG tablet Take 1 tablet (25 mg total) by mouth daily. 90 tablet 3   . VIT C-QUERCET-BIOFLV-BROMELAIN PO Take 1 tablet by mouth daily.    Taking   Allergies  Allergen Reactions  . Chlorthalidone Other (See Comments)    Ineffective at controlling blood pressure, headaches, and ringing in head  . Benicar [Olmesartan] Swelling  . Hydrochlorothiazide Other (See Comments)    BUZZING IN MY HEAD  . Metoprolol Swelling    Social  History   Tobacco Use  . Smoking status: Never Smoker  . Smokeless tobacco: Never Used  Substance Use Topics  . Alcohol use: Never    Frequency: Never    Family History  Problem Relation Age of Onset  . Hypertension Father        Died car accident with wife age 79  . Breast cancer Sister   . Heart disease Brother        Takes a blood thinner  . Hypertension Sister   . Hypertension Sister      Review of Systems  Constitutional: Negative.   HENT: Negative.   Eyes: Negative.   Respiratory: Positive for shortness of breath (with exertion).   Cardiovascular: Negative.   Gastrointestinal: Negative.   Genitourinary: Negative.   Musculoskeletal: Positive for joint pain.  Skin: Negative.   Neurological: Negative.    Endo/Heme/Allergies: Negative.   Psychiatric/Behavioral: Negative.     Objective:  Physical Exam  Constitutional: She is oriented to person, place, and time. She appears well-developed.  HENT:  Head: Normocephalic.  Eyes: Pupils are equal, round, and reactive to light.  Neck: Neck supple. No JVD present. No tracheal deviation present. No thyromegaly present.  Cardiovascular: Normal rate, regular rhythm and intact distal pulses.  Respiratory: Effort normal and breath sounds normal. No respiratory distress. She has no wheezes.  GI: Soft. There is no tenderness. There is no guarding.  Musculoskeletal:       Left hip: She exhibits decreased range of motion, decreased strength, tenderness and bony tenderness. She exhibits no swelling, no deformity and no laceration.  Lymphadenopathy:    She has no cervical adenopathy.  Neurological: She is alert and oriented to person, place, and time.  Skin: Skin is warm and dry.  Psychiatric: She has a normal mood and affect.      Labs:  Estimated body mass index is 30.47 kg/m as calculated from the following:   Height as of 07/15/17: 5\' 3"  (1.6 m).   Weight as of 07/15/17: 78 kg (172 lb).   Imaging Review Plain radiographs demonstrate severe degenerative joint disease of the left hip(s). The bone quality appears to be good for age and reported activity level.    Preoperative templating of the joint replacement has been completed, documented, and submitted to the Operating Room personnel in order to optimize intra-operative equipment management.     Assessment/Plan:  End stage arthritis, left hip  The patient history, physical examination, clinical judgement of the provider and imaging studies are consistent with end stage degenerative joint disease of the left hip and total hip arthroplasty is deemed medically necessary. The treatment options including medical management, injection therapy, arthroscopy and arthroplasty were discussed at  length. The risks and benefits of total hip arthroplasty were presented and reviewed. The risks due to aseptic loosening, infection, stiffness, dislocation/subluxation,  thromboembolic complications and other imponderables were discussed.  The patient acknowledged the explanation, agreed to proceed with the plan and consent was signed. Patient is being admitted for inpatient treatment for surgery, pain control, PT, OT, prophylactic antibiotics, VTE prophylaxis, progressive ambulation and ADL's and discharge planning.The patient is planning to be discharged home.    Anastasio Auerbach Tobin Witucki   PA-C  08/08/2017, 1:45 PM

## 2017-08-10 ENCOUNTER — Encounter (HOSPITAL_COMMUNITY): Payer: Self-pay

## 2017-08-10 NOTE — Patient Instructions (Addendum)
Your procedure is scheduled on: Tuesday, August 16, 2017   Surgery Time:  8:40AM-9:50AM   [facility]    Report to admitting at 6:10 AM   Call this number if you have problems the morning of surgery [phone]   Bring CPAP mask and tubing   Do not eat food or drink liquids :After Midnight.   Do NOT smoke after Midnight   Take these medicines the morning of surgery with A SIP OF WATER: None                               You may not have any metal on your body including hair pins, jewelry, and body piercings             Do not wear make-up, lotions, powders, perfumes/cologne, or deodorant             Do not wear nail polish.  Do not shave  48 hours prior to surgery.               Do not bring valuables to the hospital. [place] IS NOT             RESPONSIBLE   FOR VALUABLES.   Contacts, dentures or bridgework may not be worn into surgery.   Leave suitcase in the car. After surgery it may be brought to your room.   Special Instructions: Bring a copy of your healthcare power of attorney and living will documents         the day of surgery if you haven't scanned them in before.              Please read over the following fact sheets you were given:  [facility] Pre[facility] Before surgery, you can play an important role.  Because skin is not sterile, your skin needs to be as free of germs as possible.  You can reduce the number of germs on your skin by washing with CHG (chlorahexidine gluconate) soap before surgery.  CHG is an antiseptic cleaner which kills germs and bonds with the skin to continue killing germs even after washing. Please DO NOT use if you have an allergy to CHG or antibacterial soaps.  If your skin becomes reddened/irritated stop using the CHG and inform your nurse when you arrive at Short Stay. Do not shave (including legs and underarms) for at least 48 hours prior to the first CHG shower.  You may shave your  face/neck.  Please follow these instructions carefully:  1.  Shower with CHG Soap the night before surgery and the  morning of surgery.  2.  If you choose to wash your hair, wash your hair first as usual with your normal  shampoo.  3.  After you shampoo, rinse your hair and body thoroughly to remove the shampoo.                             4.  Use CHG as you would any other liquid soap.  You can apply chg directly to the skin and wash.  Gently with a scrungie or clean washcloth.  5.  Apply the CHG Soap to your body ONLY FROM THE NECK DOWN.   Do   not use on face/ open  Wound or open sores. Avoid contact with eyes, ears mouth and   genitals (private parts).                       Wash face,  Genitals (private parts) with your normal soap.             6.  Wash thoroughly, paying special attention to the area where your    surgery  will be performed.  7.  Thoroughly rinse your body with warm water from the neck down.  8.  DO NOT shower/wash with your normal soap after using and rinsing off the CHG Soap.                9.  Pat yourself dry with a clean towel.            10.  Wear clean pajamas.            11.  Place clean sheets on your bed the night of your first shower and do not  sleep with pets. Day of Surgery : Do not apply any lotions/deodorants the morning of surgery.  Please wear clean clothes to the hospital/surgery center.  FAILURE TO FOLLOW THESE INSTRUCTIONS MAY RESULT IN THE CANCELLATION OF YOUR SURGERY  PATIENT SIGNATURE_________________________________  NURSE SIGNATURE__________________________________  ________________________________________________________________________   Isabel Wong  An incentive spirometer is a tool that can help keep your lungs clear and active. This tool measures how well you are filling your lungs with each breath. Taking long deep breaths may help reverse or decrease the chance of developing breathing (pulmonary)  problems (especially infection) following:  A long period of time when you are unable to move or be active. BEFORE THE PROCEDURE   If the spirometer includes an indicator to show your best effort, your nurse or respiratory therapist will set it to a desired goal.  If possible, sit up straight or lean slightly forward. Try not to slouch.  Hold the incentive spirometer in an upright position. INSTRUCTIONS FOR USE  1. Sit on the edge of your bed if possible, or sit up as far as you can in bed or on a chair. 2. Hold the incentive spirometer in an upright position. 3. Breathe out normally. 4. Place the mouthpiece in your mouth and seal your lips tightly around it. 5. Breathe in slowly and as deeply as possible, raising the piston or the ball toward the top of the column. 6. Hold your breath for 3-5 seconds or for as long as possible. Allow the piston or ball to fall to the bottom of the column. 7. Remove the mouthpiece from your mouth and breathe out normally. 8. Rest for a few seconds and repeat Steps 1 through 7 at least 10 times every 1-2 hours when you are awake. Take your time and take a few normal breaths between deep breaths. 9. The spirometer may include an indicator to show your best effort. Use the indicator as a goal to work toward during each repetition. 10. After each set of 10 deep breaths, practice coughing to be sure your lungs are clear. If you have an incision (the cut made at the time of surgery), support your incision when coughing by placing a pillow or rolled up towels firmly against it. Once you are able to get out of bed, walk around indoors and cough well. You may stop using the incentive spirometer when instructed by your caregiver.  RISKS AND COMPLICATIONS  Take your time  so you do not get dizzy or light-headed.  If you are in pain, you may need to take or ask for pain medication before doing incentive spirometry. It is harder to take a deep breath if you are having  pain. AFTER USE  Rest and breathe slowly and easily.  It can be helpful to keep track of a log of your progress. Your caregiver can provide you with a simple table to help with this. If you are using the spirometer at home, follow these instructions: [place] IF:   You are having difficultly using the spirometer.  You have trouble using the spirometer as often as instructed.  Your pain medication is not giving enough relief while using the spirometer.  You develop fever of 100.5 F (38.1 C) or higher. SEEK IMMEDIATE MEDICAL CARE IF:   You cough up bloody sputum that had not been present before.  You develop fever of 102 F (38.9 C) or greater.  You develop worsening pain at or near the incision site. MAKE SURE YOU:   Understand these instructions.  Will watch your condition.  Will get help right away if you are not doing well or get worse. Document Released: 06/28/2006 Document Revised: 05/10/2011 Document Reviewed: 08/29/2006 [employer] Patient Information 2014 [employer], [place].   ________________________________________________________________________  WHAT IS A BLOOD TRANSFUSION? Blood Transfusion Information  A transfusion is the replacement of blood or some of its parts. Blood is made up of multiple cells which provide different functions.  Red blood cells carry oxygen and are used for blood loss replacement.  White blood cells fight against infection.  Platelets control bleeding.  Plasma helps clot blood.  Other blood products are available for specialized needs, such as hemophilia or other clotting disorders. BEFORE THE TRANSFUSION  Who gives blood for transfusions?   Healthy volunteers who are fully evaluated to make sure their blood is safe. This is blood bank blood. Transfusion therapy is the safest it has ever been in the practice of medicine. Before blood is taken from a donor, a complete history is taken to make sure that person has no history  of diseases nor engages in risky social behavior (examples are intravenous drug use or sexual activity with multiple partners). The donor's travel history is screened to minimize risk of transmitting infections, such as malaria. The donated blood is tested for signs of infectious diseases, such as HIV and hepatitis. The blood is then tested to be sure it is compatible with you in order to minimize the chance of a transfusion reaction. If you or a relative donates blood, this is often done in anticipation of surgery and is not appropriate for emergency situations. It takes many days to process the donated blood. RISKS AND COMPLICATIONS Although transfusion therapy is very safe and saves many lives, the main dangers of transfusion include:   Getting an infectious disease.  Developing a transfusion reaction. This is an allergic reaction to something in the blood you were given. Every precaution is taken to prevent this. The decision to have a blood transfusion has been considered carefully by your caregiver before blood is given. Blood is not given unless the benefits outweigh the risks. AFTER THE TRANSFUSION  Right after receiving a blood transfusion, you will usually feel much better and more energetic. This is especially true if your red blood cells have gotten low (anemic). The transfusion raises the level of the red blood cells which carry oxygen, and this usually causes an energy increase.  The  nurse administering the transfusion will monitor you carefully for complications. HOME CARE INSTRUCTIONS  No special instructions are needed after a transfusion. You may find your energy is better. Speak with your caregiver about any limitations on activity for underlying diseases you may have. SEEK MEDICAL CARE IF:   Your condition is not improving after your transfusion.  You develop redness or irritation at the intravenous (IV) site. SEEK IMMEDIATE MEDICAL CARE IF:  Any of the following symptoms  occur over the next 12 hours:  Shaking chills.  You have a temperature by mouth above 102 F (38.9 C), not controlled by medicine.  Chest, back, or muscle pain.  People around you feel you are not acting correctly or are confused.  Shortness of breath or difficulty breathing.  Dizziness and fainting.  You get a rash or develop hives.  You have a decrease in urine output.  Your urine turns a dark color or changes to pink, red, or brown. Any of the following symptoms occur over the next 10 days:  You have a temperature by mouth above 102 F (38.9 C), not controlled by medicine.  Shortness of breath.  Weakness after normal activity.  The white part of the eye turns yellow (jaundice).  You have a decrease in the amount of urine or are urinating less often.  Your urine turns a dark color or changes to pink, red, or brown. Document Released: 02/13/2000 Document Revised: 05/10/2011 Document Reviewed: 10/02/2007 [facility] Patient Information 2014 [place], [place].  _______________________________________________________________________

## 2017-08-10 NOTE — Pre-Procedure Instructions (Signed)
The following are in epic: Last office visit note and cardiac clearance by Dr. Antoine PocheHochrein 07/15/2017 EKG 07/15/2017 CBC and CMP 07/19/2017  Cardiac clearance Dr. Antoine PocheHochrein on chart

## 2017-08-12 ENCOUNTER — Other Ambulatory Visit: Payer: Self-pay

## 2017-08-12 ENCOUNTER — Encounter (HOSPITAL_COMMUNITY): Payer: Self-pay

## 2017-08-12 ENCOUNTER — Encounter (HOSPITAL_COMMUNITY)
Admission: RE | Admit: 2017-08-12 | Discharge: 2017-08-12 | Disposition: A | Discharge status: Home / Self Care | Payer: 59 | Source: Ambulatory Visit | Attending: Orthopedic Surgery | Admitting: Orthopedic Surgery

## 2017-08-12 DIAGNOSIS — M1612 Unilateral primary osteoarthritis, left hip: Secondary | ICD-10-CM | POA: Insufficient documentation

## 2017-08-12 DIAGNOSIS — M25552 Pain in left hip: Secondary | ICD-10-CM | POA: Insufficient documentation

## 2017-08-12 DIAGNOSIS — Z01812 Encounter for preprocedural laboratory examination: Secondary | ICD-10-CM | POA: Insufficient documentation

## 2017-08-12 HISTORY — DX: Unspecified osteoarthritis, unspecified site: M19.90

## 2017-08-12 LAB — ABO/RH: ABO/RH(D): O POS

## 2017-08-12 LAB — SURGICAL PCR SCREEN
MRSA, PCR: NEGATIVE
STAPHYLOCOCCUS AUREUS: NEGATIVE

## 2017-08-15 MED ORDER — TRANEXAMIC ACID 1000 MG/10ML IV SOLN
1000.0000 mg | INTRAVENOUS | Status: AC
  Administered 2017-08-16: 1000 mg via INTRAVENOUS
  Filled 2017-08-15: qty 1100

## 2017-08-16 ENCOUNTER — Other Ambulatory Visit: Payer: Self-pay

## 2017-08-16 ENCOUNTER — Encounter (HOSPITAL_COMMUNITY)
Admission: RE | Disposition: A | Discharge status: Home / Self Care | Payer: Self-pay | Source: Home / Self Care | Attending: Orthopedic Surgery

## 2017-08-16 ENCOUNTER — Inpatient Hospital Stay (HOSPITAL_COMMUNITY): Payer: 59

## 2017-08-16 ENCOUNTER — Inpatient Hospital Stay (HOSPITAL_COMMUNITY): Payer: 59 | Admitting: Anesthesiology

## 2017-08-16 ENCOUNTER — Encounter (HOSPITAL_COMMUNITY): Payer: Self-pay

## 2017-08-16 ENCOUNTER — Inpatient Hospital Stay (HOSPITAL_COMMUNITY)
Admission: RE | Admit: 2017-08-16 | Discharge: 2017-08-17 | DRG: 470 | Disposition: A | Discharge status: Home / Self Care | Payer: 59 | Attending: Orthopedic Surgery | Admitting: Orthopedic Surgery

## 2017-08-16 DIAGNOSIS — Z888 Allergy status to other drugs, medicaments and biological substances status: Secondary | ICD-10-CM | POA: Diagnosis not present

## 2017-08-16 DIAGNOSIS — M1612 Unilateral primary osteoarthritis, left hip: Secondary | ICD-10-CM | POA: Diagnosis present

## 2017-08-16 DIAGNOSIS — Z96642 Presence of left artificial hip joint: Secondary | ICD-10-CM

## 2017-08-16 DIAGNOSIS — I10 Essential (primary) hypertension: Secondary | ICD-10-CM | POA: Diagnosis present

## 2017-08-16 DIAGNOSIS — E669 Obesity, unspecified: Secondary | ICD-10-CM | POA: Diagnosis present

## 2017-08-16 DIAGNOSIS — Z96649 Presence of unspecified artificial hip joint: Secondary | ICD-10-CM

## 2017-08-16 DIAGNOSIS — G4733 Obstructive sleep apnea (adult) (pediatric): Secondary | ICD-10-CM | POA: Diagnosis present

## 2017-08-16 DIAGNOSIS — Z6830 Body mass index (BMI) 30.0-30.9, adult: Secondary | ICD-10-CM | POA: Diagnosis not present

## 2017-08-16 DIAGNOSIS — Z8249 Family history of ischemic heart disease and other diseases of the circulatory system: Secondary | ICD-10-CM | POA: Diagnosis not present

## 2017-08-16 HISTORY — DX: Presence of left artificial hip joint: Z96.642

## 2017-08-16 HISTORY — PX: TOTAL HIP ARTHROPLASTY: SHX124

## 2017-08-16 LAB — TYPE AND SCREEN
ABO/RH(D): O POS
Antibody Screen: NEGATIVE

## 2017-08-16 SURGERY — ARTHROPLASTY, HIP, TOTAL, ANTERIOR APPROACH
Anesthesia: Spinal | Site: Hip | Laterality: Left

## 2017-08-16 MED ORDER — CHLORHEXIDINE GLUCONATE 4 % EX LIQD: 60.0000 mL | Freq: Once | CUTANEOUS | Status: DC

## 2017-08-16 MED ORDER — MEPERIDINE HCL 50 MG/ML IJ SOLN: 6.2500 mg | INTRAMUSCULAR | Status: DC | PRN

## 2017-08-16 MED ORDER — DEXAMETHASONE SODIUM PHOSPHATE 10 MG/ML IJ SOLN
INTRAMUSCULAR | Status: AC
  Filled 2017-08-16: qty 1

## 2017-08-16 MED ORDER — MIDAZOLAM HCL 2 MG/2ML IJ SOLN
INTRAMUSCULAR | Status: AC
Start: 2017-08-16 — End: ?
  Filled 2017-08-16: qty 2

## 2017-08-16 MED ORDER — KETOROLAC TROMETHAMINE 30 MG/ML IJ SOLN: 30.0000 mg | Freq: Once | INTRAMUSCULAR | Status: DC | PRN

## 2017-08-16 MED ORDER — POLYETHYLENE GLYCOL 3350 17 G PO PACK: 17.0000 g | PACK | Freq: Two times a day (BID) | ORAL | 0 refills | Status: DC

## 2017-08-16 MED ORDER — HYDROCODONE-ACETAMINOPHEN 7.5-325 MG PO TABS: 1.0000 | ORAL_TABLET | ORAL | Status: DC | PRN

## 2017-08-16 MED ORDER — PROPOFOL 10 MG/ML IV BOLUS
INTRAVENOUS | Status: AC
  Filled 2017-08-16: qty 60

## 2017-08-16 MED ORDER — PROMETHAZINE HCL 25 MG/ML IJ SOLN: 6.2500 mg | INTRAMUSCULAR | Status: DC | PRN

## 2017-08-16 MED ORDER — MENTHOL 3 MG MT LOZG: 1.0000 | LOZENGE | OROMUCOSAL | Status: DC | PRN

## 2017-08-16 MED ORDER — PHENYLEPHRINE HCL 10 MG/ML IJ SOLN
INTRAVENOUS | Status: DC | PRN
  Administered 2017-08-16: 40 ug/min via INTRAVENOUS

## 2017-08-16 MED ORDER — PHENYLEPHRINE HCL 10 MG/ML IJ SOLN
INTRAMUSCULAR | Status: AC
  Filled 2017-08-16: qty 1

## 2017-08-16 MED ORDER — PHENYLEPHRINE 40 MCG/ML (10ML) SYRINGE FOR IV PUSH (FOR BLOOD PRESSURE SUPPORT)
PREFILLED_SYRINGE | INTRAVENOUS | Status: DC | PRN
  Administered 2017-08-16 (×4): 80 ug via INTRAVENOUS

## 2017-08-16 MED ORDER — CEFAZOLIN SODIUM-DEXTROSE 2-4 GM/100ML-% IV SOLN
INTRAVENOUS | Status: AC
  Filled 2017-08-16: qty 100

## 2017-08-16 MED ORDER — DOCUSATE SODIUM 100 MG PO CAPS
100.0000 mg | ORAL_CAPSULE | Freq: Two times a day (BID) | ORAL | Status: DC
  Administered 2017-08-16 – 2017-08-17 (×2): 100 mg via ORAL
  Filled 2017-08-16 (×2): qty 1

## 2017-08-16 MED ORDER — ONDANSETRON HCL 4 MG/2ML IJ SOLN
INTRAMUSCULAR | Status: DC | PRN
  Administered 2017-08-16: 4 mg via INTRAVENOUS

## 2017-08-16 MED ORDER — DEXAMETHASONE SODIUM PHOSPHATE 10 MG/ML IJ SOLN
10.0000 mg | Freq: Once | INTRAMUSCULAR | Status: AC
  Administered 2017-08-17: 10 mg via INTRAVENOUS
  Filled 2017-08-16: qty 1

## 2017-08-16 MED ORDER — DIPHENHYDRAMINE HCL 12.5 MG/5ML PO ELIX: 12.5000 mg | ORAL_SOLUTION | ORAL | Status: DC | PRN

## 2017-08-16 MED ORDER — FENTANYL CITRATE (PF) 100 MCG/2ML IJ SOLN
INTRAMUSCULAR | Status: DC | PRN
  Administered 2017-08-16: 100 ug via INTRAVENOUS

## 2017-08-16 MED ORDER — METHOCARBAMOL 500 MG PO TABS: 500.0000 mg | ORAL_TABLET | Freq: Four times a day (QID) | ORAL | Status: DC | PRN

## 2017-08-16 MED ORDER — METHOCARBAMOL 1000 MG/10ML IJ SOLN
500.0000 mg | Freq: Four times a day (QID) | INTRAVENOUS | Status: DC | PRN
  Administered 2017-08-16: 500 mg via INTRAVENOUS
  Filled 2017-08-16: qty 550

## 2017-08-16 MED ORDER — TRANEXAMIC ACID 1000 MG/10ML IV SOLN
1000.0000 mg | Freq: Once | INTRAVENOUS | Status: AC
  Administered 2017-08-16: 1000 mg via INTRAVENOUS
  Filled 2017-08-16: qty 1100

## 2017-08-16 MED ORDER — 0.9 % SODIUM CHLORIDE (POUR BTL) OPTIME
TOPICAL | Status: DC | PRN
  Administered 2017-08-16: 1000 mL

## 2017-08-16 MED ORDER — CELECOXIB 200 MG PO CAPS
200.0000 mg | ORAL_CAPSULE | Freq: Two times a day (BID) | ORAL | Status: DC
  Administered 2017-08-16 – 2017-08-17 (×3): 200 mg via ORAL
  Filled 2017-08-16 (×3): qty 1

## 2017-08-16 MED ORDER — ONDANSETRON HCL 4 MG PO TABS: 4.0000 mg | ORAL_TABLET | Freq: Four times a day (QID) | ORAL | Status: DC | PRN

## 2017-08-16 MED ORDER — DEXAMETHASONE SODIUM PHOSPHATE 10 MG/ML IJ SOLN
10.0000 mg | Freq: Once | INTRAMUSCULAR | Status: AC
  Administered 2017-08-16: 10 mg via INTRAVENOUS

## 2017-08-16 MED ORDER — MORPHINE SULFATE (PF) 2 MG/ML IV SOLN: 0.5000 mg | INTRAVENOUS | Status: DC | PRN

## 2017-08-16 MED ORDER — POLYETHYLENE GLYCOL 3350 17 G PO PACK
17.0000 g | PACK | Freq: Two times a day (BID) | ORAL | Status: DC
  Administered 2017-08-16: 17 g via ORAL
  Filled 2017-08-16 (×2): qty 1

## 2017-08-16 MED ORDER — FERROUS SULFATE 325 (65 FE) MG PO TABS
325.0000 mg | ORAL_TABLET | Freq: Three times a day (TID) | ORAL | Status: DC
  Administered 2017-08-16 – 2017-08-17 (×2): 325 mg via ORAL
  Filled 2017-08-16 (×2): qty 1

## 2017-08-16 MED ORDER — SODIUM CHLORIDE 0.9 % IV SOLN
INTRAVENOUS | Status: DC
  Administered 2017-08-16: 12:00:00 via INTRAVENOUS
  Administered 2017-08-16: 1 mL via INTRAVENOUS

## 2017-08-16 MED ORDER — FENTANYL CITRATE (PF) 100 MCG/2ML IJ SOLN
INTRAMUSCULAR | Status: AC
  Filled 2017-08-16: qty 2

## 2017-08-16 MED ORDER — PROPOFOL 500 MG/50ML IV EMUL
INTRAVENOUS | Status: DC | PRN
  Administered 2017-08-16: 100 ug/kg/min via INTRAVENOUS

## 2017-08-16 MED ORDER — MIDAZOLAM HCL 5 MG/5ML IJ SOLN
INTRAMUSCULAR | Status: DC | PRN
  Administered 2017-08-16: 2 mg via INTRAVENOUS

## 2017-08-16 MED ORDER — LIDOCAINE 2% (20 MG/ML) 5 ML SYRINGE
INTRAMUSCULAR | Status: AC
  Filled 2017-08-16: qty 5

## 2017-08-16 MED ORDER — HYDROMORPHONE HCL 1 MG/ML IJ SOLN: 0.2500 mg | INTRAMUSCULAR | Status: DC | PRN

## 2017-08-16 MED ORDER — ASPIRIN 81 MG PO CHEW
81.0000 mg | CHEWABLE_TABLET | Freq: Two times a day (BID) | ORAL | Status: DC
  Administered 2017-08-16 – 2017-08-17 (×2): 81 mg via ORAL
  Filled 2017-08-16 (×2): qty 1

## 2017-08-16 MED ORDER — ONDANSETRON HCL 4 MG/2ML IJ SOLN: 4.0000 mg | Freq: Four times a day (QID) | INTRAMUSCULAR | Status: DC | PRN

## 2017-08-16 MED ORDER — SPIRONOLACTONE 25 MG PO TABS
25.0000 mg | ORAL_TABLET | Freq: Every day | ORAL | Status: DC
  Administered 2017-08-16 – 2017-08-17 (×2): 25 mg via ORAL
  Filled 2017-08-16 (×2): qty 1

## 2017-08-16 MED ORDER — LACTATED RINGERS IV SOLN
INTRAVENOUS | Status: DC
  Administered 2017-08-16 (×2): via INTRAVENOUS

## 2017-08-16 MED ORDER — METOCLOPRAMIDE HCL 5 MG PO TABS: 5.0000 mg | ORAL_TABLET | Freq: Three times a day (TID) | ORAL | Status: DC | PRN

## 2017-08-16 MED ORDER — PHENOL 1.4 % MT LIQD: 1.0000 | OROMUCOSAL | Status: DC | PRN

## 2017-08-16 MED ORDER — BISACODYL 10 MG RE SUPP
10.0000 mg | Freq: Every day | RECTAL | Status: DC | PRN
Start: 2017-08-16 — End: 2017-08-17

## 2017-08-16 MED ORDER — PHENYLEPHRINE 40 MCG/ML (10ML) SYRINGE FOR IV PUSH (FOR BLOOD PRESSURE SUPPORT)
PREFILLED_SYRINGE | INTRAVENOUS | Status: AC
  Filled 2017-08-16: qty 10

## 2017-08-16 MED ORDER — METHOCARBAMOL 500 MG PO TABS: 500.0000 mg | ORAL_TABLET | Freq: Four times a day (QID) | ORAL | 0 refills | Status: DC | PRN

## 2017-08-16 MED ORDER — DOCUSATE SODIUM 100 MG PO CAPS: 100.0000 mg | ORAL_CAPSULE | Freq: Two times a day (BID) | ORAL | 0 refills | Status: DC

## 2017-08-16 MED ORDER — BUPIVACAINE IN DEXTROSE 0.75-8.25 % IT SOLN
INTRATHECAL | Status: DC | PRN
  Administered 2017-08-16: 1.8 mL via INTRATHECAL

## 2017-08-16 MED ORDER — MAGNESIUM CITRATE PO SOLN: 1.0000 | Freq: Once | ORAL | Status: DC | PRN

## 2017-08-16 MED ORDER — CEFAZOLIN SODIUM-DEXTROSE 2-4 GM/100ML-% IV SOLN
2.0000 g | Freq: Four times a day (QID) | INTRAVENOUS | Status: AC
  Administered 2017-08-16 (×2): 2 g via INTRAVENOUS
  Filled 2017-08-16 (×2): qty 100

## 2017-08-16 MED ORDER — GUAIFENESIN ER 600 MG PO TB12: 600.0000 mg | ORAL_TABLET | Freq: Two times a day (BID) | ORAL | Status: DC | PRN

## 2017-08-16 MED ORDER — ONDANSETRON HCL 4 MG/2ML IJ SOLN
INTRAMUSCULAR | Status: AC
  Filled 2017-08-16: qty 2

## 2017-08-16 MED ORDER — METOCLOPRAMIDE HCL 5 MG/ML IJ SOLN: 5.0000 mg | Freq: Three times a day (TID) | INTRAMUSCULAR | Status: DC | PRN

## 2017-08-16 MED ORDER — ASPIRIN 81 MG PO CHEW: 81.0000 mg | CHEWABLE_TABLET | Freq: Two times a day (BID) | ORAL | 0 refills | Status: AC

## 2017-08-16 MED ORDER — ACETAMINOPHEN 325 MG PO TABS: 325.0000 mg | ORAL_TABLET | Freq: Four times a day (QID) | ORAL | Status: DC | PRN

## 2017-08-16 MED ORDER — CEFAZOLIN SODIUM-DEXTROSE 2-4 GM/100ML-% IV SOLN
2.0000 g | INTRAVENOUS | Status: AC
  Administered 2017-08-16: 2 g via INTRAVENOUS

## 2017-08-16 MED ORDER — STERILE WATER FOR IRRIGATION IR SOLN
Status: DC | PRN
  Administered 2017-08-16: 2000 mL

## 2017-08-16 MED ORDER — ALUM & MAG HYDROXIDE-SIMETH 200-200-20 MG/5ML PO SUSP: 15.0000 mL | ORAL | Status: DC | PRN

## 2017-08-16 MED ORDER — HYDROCODONE-ACETAMINOPHEN 7.5-325 MG PO TABS: 1.0000 | ORAL_TABLET | ORAL | 0 refills | Status: DC | PRN

## 2017-08-16 MED ORDER — HYDROCODONE-ACETAMINOPHEN 5-325 MG PO TABS
1.0000 | ORAL_TABLET | ORAL | Status: DC | PRN
  Administered 2017-08-16 – 2017-08-17 (×4): 1 via ORAL
  Filled 2017-08-16 (×2): qty 1
  Filled 2017-08-16: qty 2
  Filled 2017-08-16: qty 1

## 2017-08-16 MED ORDER — FERROUS SULFATE 325 (65 FE) MG PO TABS: 325.0000 mg | ORAL_TABLET | Freq: Three times a day (TID) | ORAL | 3 refills | Status: DC

## 2017-08-16 SURGICAL SUPPLY — 44 items
ADH SKN CLS APL DERMABOND .7 (GAUZE/BANDAGES/DRESSINGS) ×1
BAG SPEC THK2 15X12 ZIP CLS (MISCELLANEOUS) ×1
BAG ZIPLOCK 12X15 (MISCELLANEOUS) ×1 IMPLANT
BLADE SAG 18X100X1.27 (BLADE) ×2 IMPLANT
CAPT HIP TOTAL 2 ×1 IMPLANT
COVER PERINEAL POST (MISCELLANEOUS) ×2 IMPLANT
COVER SURGICAL LIGHT HANDLE (MISCELLANEOUS) ×2 IMPLANT
DERMABOND ADVANCED (GAUZE/BANDAGES/DRESSINGS) ×1
DERMABOND ADVANCED .7 DNX12 (GAUZE/BANDAGES/DRESSINGS) ×1 IMPLANT
DRAPE STERI IOBAN 125X83 (DRAPES) ×2 IMPLANT
DRAPE U-SHAPE 47X51 STRL (DRAPES) ×4 IMPLANT
DRESSING AQUACEL AG SP 3.5X10 (GAUZE/BANDAGES/DRESSINGS) ×1 IMPLANT
DRSG AQUACEL AG SP 3.5X10 (GAUZE/BANDAGES/DRESSINGS) ×2
DURAPREP 26ML APPLICATOR (WOUND CARE) ×2 IMPLANT
ELECT REM PT RETURN 15FT ADLT (MISCELLANEOUS) ×2 IMPLANT
GLOVE BIO SURGEON STRL SZ 6.5 (GLOVE) ×1 IMPLANT
GLOVE BIOGEL M STRL SZ7.5 (GLOVE) ×1 IMPLANT
GLOVE BIOGEL PI IND STRL 6.5 (GLOVE) IMPLANT
GLOVE BIOGEL PI IND STRL 7.0 (GLOVE) IMPLANT
GLOVE BIOGEL PI IND STRL 7.5 (GLOVE) ×1 IMPLANT
GLOVE BIOGEL PI IND STRL 8 (GLOVE) IMPLANT
GLOVE BIOGEL PI IND STRL 8.5 (GLOVE) ×1 IMPLANT
GLOVE BIOGEL PI INDICATOR 6.5 (GLOVE) ×1
GLOVE BIOGEL PI INDICATOR 7.0 (GLOVE) ×3
GLOVE BIOGEL PI INDICATOR 7.5 (GLOVE) ×3
GLOVE BIOGEL PI INDICATOR 8 (GLOVE) ×1
GLOVE BIOGEL PI INDICATOR 8.5 (GLOVE) ×1
GLOVE ECLIPSE 7.5 STRL STRAW (GLOVE) ×1 IMPLANT
GLOVE ECLIPSE 8.0 STRL XLNG CF (GLOVE) ×4 IMPLANT
GLOVE ORTHO TXT STRL SZ7.5 (GLOVE) ×2 IMPLANT
GOWN STRL REUS W/ TWL XL LVL3 (GOWN DISPOSABLE) IMPLANT
GOWN STRL REUS W/TWL 2XL LVL3 (GOWN DISPOSABLE) ×3 IMPLANT
GOWN STRL REUS W/TWL LRG LVL3 (GOWN DISPOSABLE) ×3 IMPLANT
GOWN STRL REUS W/TWL XL LVL3 (GOWN DISPOSABLE) ×2
HOLDER FOLEY CATH W/STRAP (MISCELLANEOUS) ×2 IMPLANT
PACK ANTERIOR HIP CUSTOM (KITS) ×2 IMPLANT
SUT MNCRL AB 4-0 PS2 18 (SUTURE) ×2 IMPLANT
SUT STRATAFIX 0 PDS 27 VIOLET (SUTURE) ×2
SUT VIC AB 1 CT1 36 (SUTURE) ×6 IMPLANT
SUT VIC AB 2-0 CT1 27 (SUTURE) ×4
SUT VIC AB 2-0 CT1 TAPERPNT 27 (SUTURE) ×2 IMPLANT
SUTURE STRATFX 0 PDS 27 VIOLET (SUTURE) ×1 IMPLANT
TRAY FOLEY CATH 14FR (SET/KITS/TRAYS/PACK) ×1 IMPLANT
YANKAUER SUCT BULB TIP 10FT TU (MISCELLANEOUS) ×1 IMPLANT

## 2017-08-16 NOTE — Op Note (Signed)
NAME:  Isabel Wong NO.: [id number]      MEDICAL RECORD NO.: [id number]      FACILITY:  [facility]      PHYSICIAN:  Shelda Pal  DATE OF BIRTH:  Sep 05, 1950     DATE OF PROCEDURE:  08/16/2017                                 OPERATIVE REPORT         PREOPERATIVE DIAGNOSIS: Left  hip osteoarthritis.      POSTOPERATIVE DIAGNOSIS:  Left hip osteoarthritis.      PROCEDURE:  Left total hip replacement through an anterior approach   utilizing DePuy THR system, component size 52mm pinnacle cup, a size 36+4 neutral   Altrex liner, a size 3 Hi Tri Lock stem with a 36+1.5 delta ceramic   ball.      SURGEON:  Isabel Wong. Charlann Boxer, M.D.      ASSISTANT:  Isabel Gins, PA-C     ANESTHESIA:  Spinal.      SPECIMENS:  None.      COMPLICATIONS:  None.      BLOOD LOSS:  250 cc     DRAINS:  None.      INDICATION OF THE PROCEDURE:  Isabel Wong is a 67 y.o. female who had   presented to office for evaluation of left hip pain.  Radiographs revealed   progressive degenerative changes with bone-on-bone   articulation of the  hip joint, including subchondral cystic changes and osteophytes.  The patient had painful limited range of   motion significantly affecting their overall quality of life and function.  The patient was failing to    respond to conservative measures including medications and/or injections and activity modification and at this point was ready   to proceed with more definitive measures.  Consent was obtained for   benefit of pain relief.  Specific risks of infection, DVT, component   failure, dislocation, neurovascular injury, and need for revision surgery were reviewed in the office as well discussion of   the anterior versus posterior approach were reviewed.     PROCEDURE IN DETAIL:  The patient was brought to operative theater.   Once adequate anesthesia, preoperative antibiotics, 2 gm of Ancef, 1 gm of Tranexamic Acid, and 10 mg of  Decadron were administered, the patient was positioned supine on the [employer] table.  Once the patient was safely positioned with adequate padding of boney prominences we predraped out the hip, and used fluoroscopy to confirm orientation of the pelvis.      The left hip was then prepped and draped from proximal iliac crest to   mid thigh with a shower curtain technique.      Time-out was performed identifying the patient, planned procedure, and the appropriate extremity.     An incision was then made 2 cm lateral to the   anterior superior iliac spine extending over the orientation of the   tensor fascia lata muscle and sharp dissection was carried down to the   fascia of the muscle.      The fascia was then incised.  The muscle belly was identified and swept   laterally and retractor placed along the superior neck.  Following   cauterization of the circumflex vessels and removing some  pericapsular   fat, a second cobra retractor was placed on the inferior neck.  A T-capsulotomy was made along the line of the   superior neck to the trochanteric fossa, then extended proximally and   distally.  Tag sutures were placed and the retractors were then placed   intracapsular.  We then identified the trochanteric fossa and   orientation of my neck cut and then made a neck osteotomy with the femur on traction.  The femoral   head was removed without difficulty or complication.  Traction was let   off and retractors were placed posterior and anterior around the   acetabulum.      The labrum and foveal tissue were debrided.  I began reaming with a 44 mm   reamer and reamed up to 51 mm reamer with good bony bed preparation and a 52 mm  cup was chosen.  The final 52 mm Pinnacle cup was then impacted under fluoroscopy to confirm the depth of penetration and orientation with respect to   Abduction and forward flexion.  A screw was placed into the ilium followed by the hole eliminator.  The final   36+4  neutral Altrex liner was impacted with good visualized rim fit.  The cup was positioned anatomically within the acetabular portion of the pelvis.      At this point, the femur was rolled to 100 degrees.  Further capsule was   released off the inferior aspect of the femoral neck.  I then   released the superior capsule proximally.  With the leg in a neutral position the hook was placed laterally   along the femur under the vastus lateralis origin and elevated manually and then held in position using the hook attachment on the bed.  The leg was then extended and adducted with the leg rolled to 100   degrees of external rotation.  Retractors were placed along the medial calcar and posteriorly over the greater trochanter.  Once the proximal femur was fully   exposed, I used a box osteotome to set orientation.  I then began   broaching with the starting chili pepper broach and passed this by hand and then broached up to 3.  With the 3 broach in place I chose a high offset neck and did several trial reductions.  The offset was appropriate, leg lengths   appeared to be equal best matched with the 36+1.5 head ball trial confirmed radiographically.   Given these findings, I went ahead and dislocated the hip, repositioned all   retractors and positioned the right hip in the extended and abducted position.  The final 3 Hi Tri Lock stem was   chosen and it was impacted down to the level of neck cut.  Based on this   and the trial reductions, a final 36+1.5 delta ceramic ball was chosen and   impacted onto a clean and dry trunnion, and the hip was reduced.  The   hip had been irrigated throughout the case again at this point.  I did   reapproximate the superior capsular leaflet to the anterior leaflet   using #1 Vicryl.  The fascia of the   tensor fascia lata muscle was then reapproximated using #1 Vicryl and #0 Stratafix sutures.  The   remaining wound was closed with 2-0 Vicryl and running 4-0 Monocryl.    The hip was cleaned, dried, and dressed sterilely using Dermabond and   Aquacel dressing.  The patient was then brought  to recovery room in stable condition tolerating the procedure well.    Isabel GinsMatthew Babish, PA-C was present for the entirety of the case involved from   preoperative positioning, perioperative retractor management, general   facilitation of the case, as well as primary wound closure as assistant.            Isabel FrankelMatthew D. Charlann Boxerlin, M.D.        08/16/2017 9:31 AM

## 2017-08-16 NOTE — Plan of Care (Signed)
  Problem: Activity: Goal: Risk for activity intolerance will decrease Outcome: Progressing   Problem: Nutrition: Goal: Adequate nutrition will be maintained Outcome: Progressing   Problem: Coping: Goal: Level of anxiety will decrease Outcome: Progressing   Problem: Pain Managment: Goal: General experience of comfort will improve Outcome: Progressing   Problem: Safety: Goal: Ability to remain free from injury will improve Outcome: Progressing   

## 2017-08-16 NOTE — Interval H&P Note (Signed)
History and Physical Interval Note:  08/16/2017 7:03 AM  Isabel Wong  has presented today for surgery, with the diagnosis of Left hip osteoarthritis  The various methods of treatment have been discussed with the patient and family. After consideration of risks, benefits and other options for treatment, the patient has consented to  Procedure(s) with comments: LEFT TOTAL HIP ARTHROPLASTY ANTERIOR APPROACH (Left) - 70 mins as a surgical intervention .  The patient's history has been reviewed, patient examined, no change in status, stable for surgery.  I have reviewed the patient's chart and labs.  Questions were answered to the patient's satisfaction.     Shelda PalMatthew D Munirah Doerner

## 2017-08-16 NOTE — Anesthesia Procedure Notes (Signed)
Spinal  Patient location during procedure: OR Start time: 08/16/2017 8:21 AM End time: 08/16/2017 8:25 AM Staffing Resident/CRNA: Sharlette Dense, CRNA Performed: resident/CRNA  Preanesthetic Checklist Completed: patient identified, site marked, surgical consent, pre-op evaluation, timeout performed, IV checked, risks and benefits discussed and monitors and equipment checked Spinal Block Patient position: sitting Prep: Betadine Patient monitoring: heart rate, continuous pulse ox and blood pressure Approach: midline Location: L4-5 Injection technique: single-shot Needle Needle type: Sprotte  Needle gauge: 24 G Needle length: 9 cm Additional Notes Kit expiration date 08/30/2018 and lot # [id number] Clear free flow CSF, negative heme, negative paresthesia Tolerated well and returned to supine position

## 2017-08-16 NOTE — Discharge Instructions (Signed)

## 2017-08-16 NOTE — Anesthesia Postprocedure Evaluation (Signed)
Anesthesia Post Note  Patient: Isabel Wong  Procedure(s) Performed: LEFT TOTAL HIP ARTHROPLASTY ANTERIOR APPROACH (Left Hip)     Patient location during evaluation: PACU Anesthesia Type: Spinal Level of consciousness: awake Pain management: pain level controlled Vital Signs Assessment: post-procedure vital signs reviewed and stable Respiratory status: spontaneous breathing Cardiovascular status: stable Postop Assessment: no headache, no backache, spinal receding, patient able to bend at knees and no apparent nausea or vomiting Anesthetic complications: no    Last Vitals:  Vitals:   08/16/17 1115 08/16/17 1129  BP: 108/74 112/75  Pulse: (!) 56 (!) 50  Resp: 14 16  Temp: 36.4 C 36.4 C  SpO2: 100% 100%    Last Pain:  Vitals:   08/16/17 1129  TempSrc: Oral  PainSc:    Pain Goal:                 [name] JR,JOHN Tierre Netto

## 2017-08-16 NOTE — Evaluation (Signed)
Physical Therapy Evaluation Patient Details Name: Isabel Wong MRN: [id number] DOB: 02-18-1951 Today's Date: 08/16/2017   History of Present Illness  Left direct anterior THA  Clinical Impression  The patient ambulated x 27' with RW, Patient reports that she is not limping and is  Pleased. Pt admitted with above diagnosis. Pt currently with functional limitations due to the deficits listed below (see PT Problem List). Pt will benefit from skilled PT to increase their independence and safety with mobility to allow discharge to the venue listed below.       Follow Up Recommendations Follow surgeon's recommendation for DC plan and follow-up therapies    Equipment Recommendations  None recommended by PT    Recommendations for Other Services       Precautions / Restrictions Precautions Precautions: Fall      Mobility  Bed Mobility Overal bed mobility: Needs Assistance Bed Mobility: Supine to Sit     Supine to sit: Supervision     General bed mobility comments: manages left leg  Transfers Overall transfer level: Needs assistance Equipment used: Rolling walker (2 wheeled) Transfers: Sit to/from Stand Sit to Stand: Min assist         General transfer comment: cues for hand and left leg.   Ambulation/Gait Ambulation/Gait assistance: Min assist Gait Distance (Feet): 50 Feet Assistive device: Rolling walker (2 wheeled) Gait Pattern/deviations: Step-to pattern;Step-through pattern     General Gait Details: not antalgic, not lurching, advances left leg   Stairs            Wheelchair Mobility    Modified Rankin (Stroke Patients Only)       Balance                                             Pertinent Vitals/Pain Pain Assessment: 0-10 Pain Score: 3  Pain Location: left hip Pain Descriptors / Indicators: Discomfort;Sore Pain Intervention(s): Monitored during session;Premedicated before session;Repositioned;Ice applied    Home Living  Family/patient expects to be discharged to:: Private residence Living Arrangements: Spouse/significant other;Children Available Help at Discharge: Family Type of Home: House Home Access: Stairs to enter Entrance Stairs-Rails: None Entrance Stairs-Number of Steps: 3 Home Layout: Two level;Full bath on main level Home Equipment: Walker - 2 wheels      Prior Function Level of Independence: Independent               Hand Dominance        Extremity/Trunk Assessment   Upper Extremity Assessment Upper Extremity Assessment: Overall WFL for tasks assessed    Lower Extremity Assessment Lower Extremity Assessment: LLE deficits/detail LLE Deficits / Details: able to flex the leg is supine for about 45*       Communication      Cognition Arousal/Alertness: Awake/alert Behavior During Therapy: WFL for tasks assessed/performed Overall Cognitive Status: Within Functional Limits for tasks assessed                                        General Comments      Exercises     Assessment/Plan    PT Assessment Patient needs continued PT services  PT Problem List Decreased strength;Decreased range of motion;Decreased knowledge of use of DME;Decreased activity tolerance;Decreased knowledge of precautions;Decreased mobility;Pain  PT Treatment Interventions DME instruction;Gait training;Stair training;Functional mobility training;Therapeutic activities;Therapeutic exercise;Patient/family education    PT Goals (Current goals can be found in the Care Plan section)  Acute Rehab PT Goals Patient Stated Goal: to walk without  waddling PT Goal Formulation: With patient Time For Goal Achievement: 08/19/17 Potential to Achieve Goals: Good    Frequency 7X/week   Barriers to discharge        Co-evaluation               AM-PAC PT "6 Clicks" Daily Activity  Outcome Measure Difficulty turning over in bed (including adjusting bedclothes, sheets and  blankets)?: A Little Difficulty moving from lying on back to sitting on the side of the bed? : A Little Difficulty sitting down on and standing up from a chair with arms (e.g., wheelchair, bedside commode, etc,.)?: A Little Help needed moving to and from a bed to chair (including a wheelchair)?: A Little Help needed walking in hospital room?: A Little Help needed climbing 3-5 steps with a railing? : A Lot 6 Click Score: 17    End of Session   Activity Tolerance: Patient tolerated treatment well Patient left: in chair;with call bell/phone within reach Nurse Communication: Mobility status PT Visit Diagnosis: Unsteadiness on feet (R26.81);Pain Pain - Right/Left: Left Pain - part of body: Hip    Time: 1526-1600 PT Time Calculation (min) (ACUTE ONLY): 34 min   Charges:   PT Evaluation $PT Eval Low Complexity: 1 Low PT Treatments $Gait Training: 8-22 mins   PT G CodesBlanchard Kelch:        [name] PT [phone]   Rada HayHill, [name] 08/16/2017, 4:38 PM

## 2017-08-16 NOTE — Anesthesia Procedure Notes (Signed)
Date/Time: 08/16/2017 8:19 AM Performed by: Florene Routeeardon, Cleon Thoma L, CRNA Oxygen Delivery Method: Simple face mask

## 2017-08-16 NOTE — Transfer of Care (Signed)
Immediate Anesthesia Transfer of Care Note  Patient: Isabel Wong  Procedure(s) Performed: LEFT TOTAL HIP ARTHROPLASTY ANTERIOR APPROACH (Left Hip)  Patient Location: PACU  Anesthesia Type:Spinal  Level of Consciousness: awake and alert   Airway & Oxygen Therapy: Patient Spontanous Breathing and Patient connected to face mask oxygen  Post-op Assessment: Report given to RN and Post -op Vital signs reviewed and stable  Post vital signs: Reviewed and stable  Last Vitals:  Vitals Value Taken Time  BP    Temp    Pulse    Resp    SpO2      Last Pain:  Vitals:   08/16/17 0717  TempSrc:   PainSc: 0-No pain         Complications: No apparent anesthesia complications

## 2017-08-16 NOTE — Anesthesia Preprocedure Evaluation (Signed)
Anesthesia Evaluation  Patient identified by MRN, date of birth, ID band Patient awake    Reviewed: Allergy & Precautions, NPO status   Airway Mallampati: I       Dental no notable dental hx. (+) Teeth Intact   Pulmonary    Pulmonary exam normal breath sounds clear to auscultation       Cardiovascular hypertension, Pt. on medications Normal cardiovascular exam Rhythm:Regular Rate:Normal     Neuro/Psych negative neurological ROS  negative psych ROS   GI/Hepatic negative GI ROS, Neg liver ROS,   Endo/Other  negative endocrine ROS  Renal/GU negative Renal ROS  negative genitourinary   Musculoskeletal   Abdominal (+) + obese,   Peds  Hematology negative hematology ROS (+)   Anesthesia Other Findings   Reproductive/Obstetrics                             Anesthesia Physical Anesthesia Plan  ASA: II  Anesthesia Plan: Spinal   Post-op Pain Management:    Induction:   PONV Risk Score and Plan: 3 and Ondansetron, Dexamethasone and Midazolam  Airway Management Planned: Natural Airway and Simple Face Mask  Additional Equipment:   Intra-op Plan:   Post-operative Plan:   Informed Consent: I have reviewed the patients History and Physical, chart, labs and discussed the procedure including the risks, benefits and alternatives for the proposed anesthesia with the patient or authorized representative who has indicated his/her understanding and acceptance.     Plan Discussed with: CRNA and Surgeon  Anesthesia Plan Comments:         Anesthesia Quick Evaluation

## 2017-08-17 DIAGNOSIS — E669 Obesity, unspecified: Secondary | ICD-10-CM

## 2017-08-17 HISTORY — DX: Obesity, unspecified: E66.9

## 2017-08-17 LAB — CBC
HCT: 31.2 % — ABNORMAL LOW (ref 36.0–46.0)
HEMOGLOBIN: 10.2 g/dL — AB (ref 12.0–15.0)
MCH: 30.4 pg (ref 26.0–34.0)
MCHC: 32.7 g/dL (ref 30.0–36.0)
MCV: 93.1 fL (ref 78.0–100.0)
Platelets: 142 10*3/uL — ABNORMAL LOW (ref 150–400)
RBC: 3.35 MIL/uL — AB (ref 3.87–5.11)
RDW: 13.2 % (ref 11.5–15.5)
WBC: 8.1 10*3/uL (ref 4.0–10.5)

## 2017-08-17 LAB — BASIC METABOLIC PANEL
ANION GAP: 5 (ref 5–15)
BUN: 7 mg/dL (ref 6–20)
CHLORIDE: 113 mmol/L — AB (ref 101–111)
CO2: 26 mmol/L (ref 22–32)
CREATININE: 0.57 mg/dL (ref 0.44–1.00)
Calcium: 9.1 mg/dL (ref 8.9–10.3)
GFR calc non Af Amer: >60 mL/min (ref >60)
Glucose, Bld: 101 mg/dL — ABNORMAL HIGH (ref 65–99)
POTASSIUM: 4.5 mmol/L (ref 3.5–5.1)
SODIUM: 144 mmol/L (ref 135–145)

## 2017-08-17 NOTE — Progress Notes (Signed)
Physical Therapy Treatment Patient Details Name: Isabel Wong MRN: [id number] DOB: 10-11-1950 Today's Date: 08/17/2017    History of Present Illness Left direct anterior THA    PT Comments    The patient is  Progressing well. Ready for DC.    Follow Up Recommendations  Follow surgeon's recommendation for DC plan and follow-up therapies;No PT follow up     Equipment Recommendations  None recommended by PT    Recommendations for Other Services       Precautions / Restrictions Precautions Precautions: Fall Restrictions Weight Bearing Restrictions: No    Mobility  Bed Mobility   Bed Mobility: Supine to Sit;Sit to Supine     Supine to sit: Supervision Sit to supine: Supervision   General bed mobility comments: self assisting left leg with right  Transfers Overall transfer level: Needs assistance Equipment used: Rolling walker (2 wheeled) Transfers: Sit to/from Stand Sit to Stand: Supervision            Ambulation/Gait Ambulation/Gait assistance: Supervision Gait Distance (Feet): 100 Feet Assistive device: Rolling walker (2 wheeled) Gait Pattern/deviations: Step-to pattern;Step-through pattern     General Gait Details: cues for sequence   Stairs Stairs: Yes Stairs assistance: Min assist Stair Management: Backwards;Forwards;With walker;Two rails   General stair comments: daughter present to assist with Backwards x 3-practiced x 2, then forwards with 2 rails.   Wheelchair Mobility    Modified Rankin (Stroke Patients Only)       Balance                                            Cognition Arousal/Alertness: Awake/alert                                            Exercises Total Joint Exercises Ankle Circles/Pumps: AROM;Both;10 reps Quad Sets: AROM;Both;10 reps Short Arc Quad: AROM;Left;10 reps Heel Slides: AAROM;Left;10 reps Hip ABduction/ADduction: AAROM;Left;10 reps Long Arc Quad: AAROM;Left;10  reps Knee Flexion: AROM;Standing;10 reps;Left Marching in Standing: Standing;Left;10 reps Standing Hip Extension: Standing;Left;10 reps    General Comments        Pertinent Vitals/Pain Pain Score: 2  Pain Descriptors / Indicators: Discomfort;Sore Pain Intervention(s): Monitored during session;Premedicated before session    Home Living                      Prior Function            PT Goals (current goals can now be found in the care plan section) Progress towards PT goals: Progressing toward goals    Frequency    7X/week      PT Plan Current plan remains appropriate    Co-evaluation              AM-PAC PT "6 Clicks" Daily Activity  Outcome Measure  Difficulty turning over in bed (including adjusting bedclothes, sheets and blankets)?: None Difficulty moving from lying on back to sitting on the side of the bed? : None Difficulty sitting down on and standing up from a chair with arms (e.g., wheelchair, bedside commode, etc,.)?: A Little Help needed moving to and from a bed to chair (including a wheelchair)?: A Little Help needed walking in hospital room?: A Little Help needed climbing 3-5 steps with a railing? :  A Lot 6 Click Score: 19    End of Session   Activity Tolerance: Patient tolerated treatment well Patient left: in bed;with call bell/phone within reach;with family/visitor present Nurse Communication: Mobility status PT Visit Diagnosis: Unsteadiness on feet (R26.81);Pain Pain - Right/Left: Left Pain - part of body: Hip     Time: 1121-1209 PT Time Calculation (min) (ACUTE ONLY): 48 min  Charges:  $Gait Training: 8-22 mins $Therapeutic Exercise: 8-22 mins $Self Care/Home Management: 8-22                    G CodesBlanchard Wong PT [phone]    Rada Hay 08/17/2017, 1:15 PM

## 2017-08-17 NOTE — Progress Notes (Signed)
     Subjective: 1 Day Post-Op Procedure(s) (LRB): LEFT TOTAL HIP ARTHROPLASTY ANTERIOR APPROACH (Left)   Patient reports pain as mild, pain controlled. No events throughout the night. She came to the realization when up with therapy yesterday that she is walking straight again, very excited with the realization.  Ready to be discharged home if she continues to do well with surgery.    Objective:   VITALS:   Vitals:   08/17/17 0215 08/17/17 0613  BP: 92/64 110/71  Pulse: (!) 54 (!) 47  Resp: 16 18  Temp: (!) 97.4 F (36.3 C) 97.7 F (36.5 C)  SpO2: 100% 100%    Dorsiflexion/Plantar flexion intact Incision: dressing C/D/I No cellulitis present Compartment soft  LABS Recent Labs    08/17/17 0532  HGB 10.2*  HCT 31.2*  WBC 8.1  PLT 142*    Recent Labs    08/17/17 0532  NA 144  K 4.5  BUN 7  CREATININE 0.57  GLUCOSE 101*     Assessment/Plan: 1 Day Post-Op Procedure(s) (LRB): LEFT TOTAL HIP ARTHROPLASTY ANTERIOR APPROACH (Left) Foley cath d/c'ed Advance diet Up with therapy D/C IV fluids Discharge home [facility] Follow up with OLIN,Luismanuel Corman D in 2 weeks.  Contact information:  [facility]) [address], [address] [place] [id number] [phone]    Obese (BMI 30-39.9) Estimated body mass index is 30.97 kg/m as calculated from the following:   Height as of this encounter: 5\' 2"  (1.575 m).   Weight as of this encounter: 76.8 kg (169 lb 5 oz). Patient also counseled that weight may inhibit the healing process Patient counseled that losing weight will help with future health issues         Anastasio AuerbachMatthew S. Allysen Lazo   PAC  08/17/2017, 8:24 AM

## 2017-08-23 NOTE — Discharge Summary (Signed)
Physician Discharge Summary  Patient ID: Isabel ManeRita D Wynia MRN: [id number] DOB/AGE: 66-21-1952 66 y.o.  Admit date: 08/16/2017 Discharge date: 08/17/2017   Procedures:  Procedure(s) (LRB): LEFT TOTAL HIP ARTHROPLASTY ANTERIOR APPROACH (Left)  Attending Physician:  Dr. Durene RomansMatthew Olin   Admission Diagnoses:   Left hip primary OA / pain  Discharge Diagnoses:  Principal Problem:   S/P left THA, AA Active Problems:   Obese  Past Medical History:  Diagnosis Date  . Arthritis   . Hyperlipidemia   . Hypertension   . OSA (obstructive sleep apnea)    CPAP nasal    HPI:    Isabel Wong, 67 y.o. female, has a history of pain and functional disability in the left hip(s) due to arthritis and patient has failed non-surgical conservative treatments for greater than 12 weeks to include NSAID's and/or analgesics and activity modification.  Onset of symptoms was gradual starting ~3 years ago with gradually worsening course since that time.The patient noted no past surgery on the left hip(s).  Patient currently rates pain in the left hip at 10 out of 10 with activity. Patient has night pain, worsening of pain with activity and weight bearing, trendelenberg gait, pain that interfers with activities of daily living and pain with passive range of motion. Patient has evidence of periarticular osteophytes and joint space narrowing by imaging studies. This condition presents safety issues increasing the risk of falls. There is no current active infection.  Risks, benefits and expectations were discussed with the patient.  Risks including but not limited to the risk of anesthesia, blood clots, nerve damage, blood vessel damage, failure of the prosthesis, infection and up to and including death.  Patient understand the risks, benefits and expectations and wishes to proceed with surgery.    PCP: Deatra JamesSun, Vyvyan, MD   Discharged Condition: good  Hospital Course:  Patient underwent the above stated procedure on  08/16/2017. Patient tolerated the procedure well and brought to the recovery room in good condition and subsequently to the floor.  POD #1 BP: 110/71 ; Pulse: 47 ; Temp: 97.7 F (36.5 C) ; Resp: 18 Patient reports pain as mild, pain controlled. No events throughout the night. She came to the realization when up with therapy yesterday that she is walking straight again, very excited with the realization.  Ready to be discharged home. Dorsiflexion/plantar flexion intact, incision: dressing C/D/I, no cellulitis present and compartment soft.   LABS  Basename    HGB     10.2  HCT     31.2    Discharge Exam: General appearance: alert, cooperative and no distress Extremities: Homans sign is negative, no sign of DVT, no edema, redness or tenderness in the calves or thighs and no ulcers, gangrene or trophic changes  Disposition:  Home with follow up in 2 weeks   Follow-up Information    Durene Romanslin, Rainy Rothman, MD. Schedule an appointment as soon as possible for a visit in 2 weeks.   Specialty:  Orthopedic Surgery Contact information: [address] [place] [address] [place] [address] [phone]           Discharge Instructions    Call MD / Call 911   Complete by:  As directed    If you experience chest pain or shortness of breath, CALL 911 and be transported to the hospital emergency room.  If you develope a fever above 101 F, pus (white drainage) or increased drainage or redness at the wound, or calf pain, call your surgeon's office.  Change dressing   Complete by:  As directed    Maintain surgical dressing until follow up in the clinic. If the edges start to pull up, may reinforce with tape. If the dressing is no longer working, may remove and cover with gauze and tape, but must keep the area dry and clean.  Call with any questions or concerns.   Constipation Prevention   Complete by:  As directed    Drink plenty of fluids.  Prune juice may be helpful.  You may use a stool softener, such  as Colace (over the counter) 100 mg twice a day.  Use MiraLax (over the counter) for constipation as needed.   Diet - low sodium heart healthy   Complete by:  As directed    Discharge instructions   Complete by:  As directed    Maintain surgical dressing until follow up in the clinic. If the edges start to pull up, may reinforce with tape. If the dressing is no longer working, may remove and cover with gauze and tape, but must keep the area dry and clean.  Follow up in 2 weeks at [facility] Call with any questions or concerns.   Increase activity slowly as tolerated   Complete by:  As directed    Weight bearing as tolerated with assist device (walker, cane, etc) as directed, use it as long as suggested by your surgeon or therapist, typically at least 4-6 weeks.   TED hose   Complete by:  As directed    Use stockings (TED hose) for 2 weeks on both leg(s).  You may remove them at night for sleeping.      Allergies as of 08/17/2017      Reactions   Chlorthalidone Other (See Comments)   Ineffective at controlling blood pressure, headaches, and ringing in head   Benicar [olmesartan] Swelling   Hydrochlorothiazide Other (See Comments)   BUZZING IN MY HEAD   Metoprolol Swelling      Medication List    TAKE these medications   aspirin 81 MG chewable tablet Commonly known as:  ASPIRIN CHILDRENS Chew 1 tablet (81 mg total) by mouth 2 (two) times daily. Take for 4 weeks, then resume regular dose.   CALCIUM-MAGNESIUM-VITAMIN D PO Take 5 mLs by mouth daily.   docusate sodium 100 MG capsule Commonly known as:  COLACE Take 1 capsule (100 mg total) by mouth 2 (two) times daily.   ferrous sulfate 325 (65 FE) MG tablet Commonly known as:  FERROUSUL Take 1 tablet (325 mg total) by mouth 3 (three) times daily with meals.   Flax Seeds Powd Take 1 tablet by mouth daily.   GLUCOSAMINE PO Take 1 tablet by mouth daily as needed (for joints).   HYDROcodone-acetaminophen 7.5-325 MG  tablet Commonly known as:  NORCO Take 1-2 tablets by mouth every 4 (four) hours as needed for moderate pain.   methocarbamol 500 MG tablet Commonly known as:  ROBAXIN Take 1 tablet (500 mg total) by mouth every 6 (six) hours as needed for muscle spasms.   MUCINEX 600 MG 12 hr tablet Generic drug:  guaiFENesin Take 600 mg by mouth 2 (two) times daily as needed for cough or to loosen phlegm.   MULTIPLE VITAMIN PO Take 1 tablet by mouth daily.   OMEGA 3 PO Take 3 capsules by mouth daily.   polyethylene glycol packet Commonly known as:  MIRALAX / GLYCOLAX Take 17 g by mouth 2 (two) times daily.   spironolactone 25 MG  tablet Commonly known as:  ALDACTONE Take 1 tablet (25 mg total) by mouth daily.   VIT C-QUERCET-BIOFLV-BROMELAIN PO Take 1 tablet by mouth daily.            Discharge Care Instructions  (From admission, onward)        Start     Ordered   08/17/17 0000  Change dressing    Comments:  Maintain surgical dressing until follow up in the clinic. If the edges start to pull up, may reinforce with tape. If the dressing is no longer working, may remove and cover with gauze and tape, but must keep the area dry and clean.  Call with any questions or concerns.   08/17/17 0831       Signed: Anastasio Auerbach. Konstance Happel   PA-C  08/23/2017, 9:18 AM

## 2017-08-31 ENCOUNTER — Other Ambulatory Visit: Payer: Self-pay | Admitting: Cardiology

## 2017-11-07 DIAGNOSIS — H40013 Open angle with borderline findings, low risk, bilateral: Secondary | ICD-10-CM | POA: Insufficient documentation

## 2018-06-26 ENCOUNTER — Telehealth: Payer: Self-pay | Admitting: *Deleted

## 2018-06-26 NOTE — Telephone Encounter (Signed)
LMOM @ 10:08 am, re: appointment.

## 2018-07-07 ENCOUNTER — Telehealth: Payer: Self-pay | Admitting: Cardiology

## 2018-07-07 NOTE — Telephone Encounter (Signed)
LVM to schedule recall. °

## 2018-07-20 ENCOUNTER — Telehealth: Payer: Self-pay | Admitting: Cardiology

## 2018-07-20 NOTE — Telephone Encounter (Signed)
Smartphone/tablet video pre-reg complete, MyChart pending, verbal consent given. 07/20/2018 MS

## 2018-07-21 ENCOUNTER — Telehealth: Payer: Self-pay | Admitting: Cardiology

## 2018-07-21 NOTE — Telephone Encounter (Signed)
Isabel Wong states that she did not receive her link for her appt yet. Email was incorrect but is now updated to correct email. Please resend the link.

## 2018-07-21 NOTE — Telephone Encounter (Signed)
Called pt she states that she is calling margaret so she can resend her "the email". Will forward message to [facility]

## 2018-07-24 NOTE — Progress Notes (Signed)
Virtual Visit via Video Note   This visit type was conducted due to national recommendations for restrictions regarding the COVID-19 Pandemic (e.g. social distancing) in an effort to limit this patient's exposure and mitigate transmission in our community.  Due to her co-morbid illnesses, this patient is at least at moderate risk for complications without adequate follow up.  This format is felt to be most appropriate for this patient at this time.  All issues noted in this document were discussed and addressed.  A limited physical exam was performed with this format.  Please refer to the patient's chart for her consent to telehealth for [facility].   Date:  07/25/2018   ID:  AMANDALYNN GUTTIERREZ, DOB 12-Jun-1950, MRN [id number]  Patient Location: Home Provider Location: Home  PCP:  Deatra Arlana Canizales, MD  Cardiologist:  Rollene Rotunda, MD  Electrophysiologist:  None   Evaluation Performed:  Follow-Up Visit  Chief Complaint:  Hypertension  History of Present Illness:    Isabel Wong is a 68 y.o. female follow up of difficult to control BPs.   Since I last saw her she has had hip replacement. She did very well with this.  The patient denies any new symptoms such as chest discomfort, neck or arm discomfort. There has been no new shortness of breath, PND or orthopnea. There have been no reported palpitations, presyncope or syncope.    The patient does not have symptoms concerning for COVID-19 infection (fever, chills, cough, or new shortness of breath).    Past Medical History:  Diagnosis Date  . Arthritis   . Hyperlipidemia   . Hypertension   . OSA (obstructive sleep apnea)    CPAP nasal   Past Surgical History:  Procedure Laterality Date  . COLONOSCOPY    . TOTAL HIP ARTHROPLASTY Left 08/16/2017   Procedure: LEFT TOTAL HIP ARTHROPLASTY ANTERIOR APPROACH;  Surgeon: Durene Romans, MD;  Location: [facility];  Service: Orthopedics;  Laterality: Left;     Current Meds  Medication Sig  .  CALCIUM-MAGNESIUM-VITAMIN D PO Take 5 mLs by mouth daily.   . Flaxseed, Linseed, (FLAX SEEDS) POWD Take 1 tablet by mouth daily.   Marland Kitchen guaiFENesin (MUCINEX) 600 MG 12 hr tablet Take 600 mg by mouth 2 (two) times daily as needed for cough or to loosen phlegm.   . MULTIPLE VITAMIN PO Take 1 tablet by mouth daily.  Marland Kitchen spironolactone (ALDACTONE) 25 MG tablet TAKE 1 TABLET BY MOUTH EVERY DAY  . VIT C-QUERCET-BIOFLV-BROMELAIN PO Take 1 tablet by mouth daily.   . [DISCONTINUED] docusate sodium (COLACE) 100 MG capsule Take 1 capsule (100 mg total) by mouth 2 (two) times daily.  . [DISCONTINUED] Omega-3 Fatty Acids (OMEGA 3 PO) Take 3 capsules by mouth daily.     Allergies:   Chlorthalidone; Benicar [olmesartan]; Hydrochlorothiazide; and Metoprolol   Social History   Tobacco Use  . Smoking status: Never Smoker  . Smokeless tobacco: Never Used  Substance Use Topics  . Alcohol use: Never    Frequency: Never  . Drug use: Never     Family Hx: The patient's family history includes Breast cancer in her sister; Heart disease in her brother; Hypertension in her father, sister, and sister.  ROS:   Please see the history of present illness.    As stated in the HPI and negative for all other systems.   Prior CV studies:   The following studies were reviewed today:    Labs/Other Tests and Data Reviewed:  EKG:  No ECG reviewed.  Recent Labs: 08/17/2017: BUN 7; Creatinine, Ser 0.57; Hemoglobin 10.2; Platelets 142; Potassium 4.5; Sodium 144   Recent Lipid Panel Lab Results  Component Value Date/Time   CHOL 177 07/19/2017 09:12 AM   TRIG 54 07/19/2017 09:12 AM   HDL 53 07/19/2017 09:12 AM   CHOLHDL 3.3 07/19/2017 09:12 AM   LDLCALC 113 (H) 07/19/2017 09:12 AM    Wt Readings from Last 3 Encounters:  07/25/18 167 lb (75.8 kg)  08/16/17 169 lb 5 oz (76.8 kg)  08/12/17 169 lb 5 oz (76.8 kg)     Objective:    Vital Signs:  BP 108/76   Pulse 64   Ht 5\' 2"  (1.575 m)   Wt 167 lb (75.8  kg)   BMI 30.54 kg/m    VITAL SIGNS:  reviewed GEN:  no acute distress EYES:  sclerae anicteric, EOMI - Extraocular Movements Intact NEURO:  alert and oriented x 3, no obvious focal deficit PSYCH:  normal affect  ASSESSMENT & PLAN:    HTN:  The blood pressure is at target. No change in medications is indicated. We will continue with therapeutic lifestyle changes (TLC).    RISK REDUCTION:  No change in therapy.     COVID-19 Education: The signs and symptoms of COVID-19 were discussed with the patient and how to seek care for testing (follow up with PCP or arrange E-visit).   The importance of social distancing was discussed today.  Time:   Today, I have spent 16 minutes with the patient with telehealth technology discussing the above problems.     Medication Adjustments/Labs and Tests Ordered: Current medicines are reviewed at length with the patient today.  Concerns regarding medicines are outlined above.   Tests Ordered: No orders of the defined types were placed in this encounter.   Medication Changes: No orders of the defined types were placed in this encounter.   Disposition:  Follow up with me in one year.   Signed, Rollene RotundaJames Renlee Floor, MD  07/25/2018 11:59 AM    [facility]

## 2018-07-25 ENCOUNTER — Telehealth (INDEPENDENT_AMBULATORY_CARE_PROVIDER_SITE_OTHER): Payer: 59 | Admitting: Cardiology

## 2018-07-25 ENCOUNTER — Encounter: Payer: Self-pay | Admitting: Cardiology

## 2018-07-25 VITALS — BP 108/76 | HR 64 | Ht 62.0 in | Wt 167.0 lb

## 2018-07-25 DIAGNOSIS — E785 Hyperlipidemia, unspecified: Secondary | ICD-10-CM

## 2018-07-25 DIAGNOSIS — Z7189 Other specified counseling: Secondary | ICD-10-CM

## 2018-07-25 DIAGNOSIS — I10 Essential (primary) hypertension: Secondary | ICD-10-CM

## 2018-07-25 HISTORY — DX: Other specified counseling: Z71.89

## 2018-07-25 MED ORDER — SPIRONOLACTONE 25 MG PO TABS: 25.0000 mg | ORAL_TABLET | Freq: Every day | ORAL | 3 refills | Status: DC

## 2018-07-25 NOTE — Telephone Encounter (Signed)
Call pt to inform her that she would receive a call 10-15 mins prior to her appointment and that once Dr. Antoine Poche is ready she would receive a link. Line got disconnected during call. Call back and left message that someone would contact her closer to her appointment time.

## 2018-07-25 NOTE — Addendum Note (Signed)
Addended by: Barrie Dunker on: 07/25/2018 12:16 PM   Modules accepted: Orders

## 2018-07-25 NOTE — Telephone Encounter (Signed)
Pt had virtual visit with Dr Hochrein today 

## 2018-07-25 NOTE — Patient Instructions (Signed)

## 2018-07-25 NOTE — Telephone Encounter (Signed)
New message    Patient states that she has not received the link for the virtual visit today. The patient verified her email and the email is correct that is on file.

## 2018-10-23 ENCOUNTER — Telehealth: Payer: Self-pay | Admitting: Cardiology

## 2018-10-23 NOTE — Telephone Encounter (Signed)
Scheduled appointment for Friday

## 2018-10-23 NOTE — Telephone Encounter (Signed)
New Message   Pt c/o of Chest Pain: STAT if CP now or developed within 24 hours  1. Are you having CP right now? No   2. Are you experiencing any other symptoms (ex. SOB, nausea, vomiting, sweating)? No symptoms   3. How long have you been experiencing CP? Started last night   4. Is your CP continuous or coming and going? Coming and going   5. Have you taken Nitroglycerin? No   Patient states that she started having sharp chest pains on and off last night. I did offer and appt for 8/27 patient wants something sooner. She wanted to be seen today. Please call to discuss.  ?

## 2018-10-23 NOTE — Telephone Encounter (Signed)
She can be added to my Friday schedule

## 2018-10-23 NOTE — Telephone Encounter (Signed)
Returned call to patient of Dr. Percival Spanish concerning chest pain that started last night. She has not had any chest pain like this before "in that area" - sharp pain above left breast. She reports no tightness, no nausea, no SOB. This AM she felt a little tinge in her chest again, less severe than last night, and several times throughout the day. She correlated this today to strenuous activity. She is walking with a cane and uses this on the L side. She is unsure if maybe this is a strain (?). She is scheduled for R hip surgery in October 2020 - not yet scheduled.   Advised to seek ED eval for chest pain that worsens in intensity, frequency, lasts >30 minutes and/or with associated symptoms of N/V, jaw/back pain, SOB.   Advised would route to Dr. Percival Spanish & primary nurse to review and advise

## 2018-10-26 NOTE — Progress Notes (Signed)
Cardiology Office Note   Date:  10/27/2018   ID:  JOHN VASCONCELOS, DOB 1950/07/17, MRN [id number]  PCP:  Donald Prose, MD  Cardiologist:   Minus Breeding, MD  Referring:  Donald Prose, MD  Chief Complaint  Patient presents with  . Chest Pain      History of Present Illness: Isabel Wong is a 68 y.o. female who is referred by Donald Prose, MD for evaluation of difficult to control BPs.   She called recently wth chest pain.  She reports that this happened a few days ago.  She was feeling a sharp pain.  It was some fleeting discomfort 1 evening.  It felt more like a "movement" in a focal point in her chest and she could not really describe it as a flutter.  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  She sometimes has to lift heavy things because of this but she is not noticing the discomfort or any symptoms while she is doing that activity.  She might get some after she is done being physically active.  She will notice some discomfort when she moves her left arm a certain way.  She is having any pain in his left arm that she is having pain from right hip discomfort operated on in October.  She is not describing substernal pain or neck discomfort.  She is not describing associated nausea vomiting or shortness of breath.  She not having any PND or orthopnea.  She actually describes similar symptoms previously as well.  Of note she had hip surgery last fall on her left leg and it actually went very well.  Her blood pressures have been well controlled.   Past Medical History:  Diagnosis Date  . Arthritis   . Hyperlipidemia   . Hypertension   . OSA (obstructive sleep apnea)    CPAP nasal    Past Surgical History:  Procedure Laterality Date  . COLONOSCOPY    . TOTAL HIP ARTHROPLASTY Left 08/16/2017   Procedure: LEFT TOTAL HIP ARTHROPLASTY ANTERIOR APPROACH;  Surgeon: Paralee Cancel, MD;  Location: [facility];  Service: Orthopedics;  Laterality: Left;     Current Outpatient  Medications  Medication Sig Dispense Refill  . CALCIUM-MAGNESIUM-VITAMIN D PO Take 5 mLs by mouth daily.     . Coenzyme Q10 (CO Q 10 PO) Take 2 capsules by mouth daily.    . Flaxseed, Linseed, (FLAX SEEDS) POWD Take 1 tablet by mouth daily.     Marland Kitchen guaiFENesin (MUCINEX) 600 MG 12 hr tablet Take 600 mg by mouth 2 (two) times daily as needed for cough or to loosen phlegm.     . MULTIPLE VITAMIN PO Take 1 tablet by mouth daily.    Marland Kitchen spironolactone (ALDACTONE) 25 MG tablet Take 1 tablet (25 mg total) by mouth daily. 90 tablet 3  . TURMERIC PO Take 2 capsules by mouth daily.     No current facility-administered medications for this visit.     Allergies:   Chlorthalidone, Benicar [olmesartan], Hydrochlorothiazide, and Metoprolol    ROS:  Please see the history of present illness.   Otherwise, review of systems are positive for none.   All other systems are reviewed and negative.    PHYSICAL EXAM: VS:  BP 128/72   Pulse 82   Ht _0  (1.6 m)   Wt 158 lb 3.2 oz (71.8 kg)   BMI 28.02 kg/m  , BMI Body mass index is 28.02 kg/m.  GENERAL:  Well appearing NECK:  No jugular venous distention, waveform within normal limits, carotid upstroke brisk and symmetric, no bruits, no thyromegaly LUNGS:  Clear to auscultation bilaterally CHEST:  Unremarkable HEART:  PMI not displaced or sustained,S1 and S2 within normal limits, no S3, no S4, no clicks, no rubs, no murmurs ABD:  Flat, positive bowel sounds normal in frequency in pitch, no bruits, no rebound, no guarding, no midline pulsatile mass, no hepatomegaly, no splenomegaly EXT:  2 plus pulses throughout, no edema, no cyanosis no clubbing   EKG:  EKG is  ordered today. Sinus rhythm, rate 82, axis within normal limits, intervals within normal limits, no acute ST-T wave changes.  Recent Labs: No results found for requested labs within last 8760 hours.    Lipid Panel    Component Value Date/Time   CHOL 177 07/19/2017 0912   TRIG 54 07/19/2017  0912   HDL 53 07/19/2017 0912   CHOLHDL 3.3 07/19/2017 0912   LDLCALC 113 (H) 07/19/2017 0912      Wt Readings from Last 3 Encounters:  10/27/18 158 lb 3.2 oz (71.8 kg)  07/25/18 167 lb (75.8 kg)  08/16/17 169 lb 5 oz (76.8 kg)      Other studies Reviewed: Additional studies/ records that were reviewed today include: Labs Review of the above records demonstrates:  See elsewhere   ASSESSMENT AND PLAN:  CHEST PAIN: Chest pain is atypical.  She has no significant risk factors.  Exam and EKG are unremarkable.  I think the pretest probability of obstructive coronary disease is very low.  No further cardiovascular testing is suggested.  HTN:  The blood pressure is at target.  I can check a C met today as she is on spironolactone.   RISK REDUCTION:   I am going to check a lipid profile.  PREOP:    The patient is acceptable risk for the planned surgery.  No further testing is indicated.    Current medicines are reviewed at length with the patient today.  The patient does not have concerns regarding medicines.  The following changes have been made:  None  Labs/ tests ordered today include:   None  Orders Placed This Encounter  Procedures  . Lipid panel  . Comprehensive metabolic panel  . EKG 12-Lead     Disposition:   FU with me as needed.    Signed, Minus Breeding, MD  10/27/2018 10:24 AM    [name]

## 2018-10-27 ENCOUNTER — Telehealth: Payer: Self-pay

## 2018-10-27 ENCOUNTER — Ambulatory Visit: Payer: 59 | Admitting: Cardiology

## 2018-10-27 ENCOUNTER — Other Ambulatory Visit: Payer: Self-pay

## 2018-10-27 ENCOUNTER — Encounter: Payer: Self-pay | Admitting: Cardiology

## 2018-10-27 VITALS — BP 128/72 | HR 82 | Ht 63.0 in | Wt 158.2 lb

## 2018-10-27 DIAGNOSIS — I10 Essential (primary) hypertension: Secondary | ICD-10-CM

## 2018-10-27 DIAGNOSIS — R072 Precordial pain: Secondary | ICD-10-CM | POA: Diagnosis not present

## 2018-10-27 LAB — COMPREHENSIVE METABOLIC PANEL
ALT: 16 IU/L (ref 0–32)
AST: 21 IU/L (ref 0–40)
Albumin/Globulin Ratio: 2.2 (ref 1.2–2.2)
Albumin: 4.6 g/dL (ref 3.8–4.8)
Alkaline Phosphatase: 66 IU/L (ref 39–117)
BUN/Creatinine Ratio: 14 (ref 12–28)
BUN: 10 mg/dL (ref 8–27)
Bilirubin Total: 0.7 mg/dL (ref 0.0–1.2)
CO2: 21 mmol/L (ref 20–29)
Calcium: 9.8 mg/dL (ref 8.7–10.3)
Chloride: 107 mmol/L — ABNORMAL HIGH (ref 96–106)
Creatinine, Ser: 0.71 mg/dL (ref 0.57–1.00)
GFR calc Af Amer: 102 mL/min/{1.73_m2} (ref >59)
GFR calc non Af Amer: 88 mL/min/{1.73_m2} (ref >59)
Globulin, Total: 2.1 g/dL (ref 1.5–4.5)
Glucose: 82 mg/dL (ref 65–99)
Potassium: 4.5 mmol/L (ref 3.5–5.2)
Sodium: 144 mmol/L (ref 134–144)
Total Protein: 6.7 g/dL (ref 6.0–8.5)

## 2018-10-27 LAB — LIPID PANEL
Chol/HDL Ratio: 2.9 ratio (ref 0.0–4.4)
Cholesterol, Total: 152 mg/dL (ref 100–199)
HDL: 53 mg/dL (ref >39)
LDL Calculated: 90 mg/dL (ref 0–99)
Triglycerides: 45 mg/dL (ref 0–149)
VLDL Cholesterol Cal: 9 mg/dL (ref 5–40)

## 2018-10-27 NOTE — Telephone Encounter (Signed)
   [facility] Pre-operative Risk Assessment    Request for surgical clearance:  1. What type of surgery is being performed? Right Total Hip Arthroplasty    2. When is this surgery scheduled? 12/18/2018  3. What type of clearance is required (medical clearance vs. Pharmacy clearance to hold med vs. Both)? medical  4. Are there any medications that need to be held prior to surgery and how long? N/a   5. Practice name and name of physician performing surgery? [facility] Dr. Rodman Key   6. What is your office phone number [phone]    7.   What is your office fax [phone]  8.   Anesthesia type (None, local, MAC, general) ? spinal   Isabel Wong 10/27/2018, 4:23 PM  _________________________________________________________________   (provider comments below)

## 2018-10-27 NOTE — Patient Instructions (Signed)
Medication Instructions:  Your physician recommends that you continue on your current medications as directed. Please refer to the Current Medication list given to you today.  If you need a refill on your cardiac medications before your next appointment, please call your pharmacy.   Lab work: LP/CMET TODAY  If you have labs (blood work) drawn today and your tests are completely normal, you will receive your results only by: . MyChart Message (if you have MyChart) OR . A paper copy in the mail If you have any lab test that is abnormal or we need to change your treatment, we will call you to review the results.  Testing/Procedures: NONE  Follow-Up: AS NEEDED   

## 2018-10-29 NOTE — Telephone Encounter (Signed)
   Primary Cardiologist: Minus Breeding, MD  Chart reviewed as part of pre-operative protocol coverage. Given past medical history and time since last visit, based on ACC/AHA guidelines, Isabel Wong would be at acceptable risk for the planned procedure without further cardiovascular testing. Patient was cleared by Dr. Percival Spanish in his recent note on 10/27/2018  I will route this recommendation to the requesting party via [facility] fax function and remove from pre-op pool.  Please call with questions.  [place], [place] 10/29/2018, 5:25 PM

## 2019-07-24 DIAGNOSIS — R072 Precordial pain: Secondary | ICD-10-CM | POA: Insufficient documentation

## 2019-07-24 HISTORY — DX: Precordial pain: R07.2

## 2019-07-24 NOTE — Progress Notes (Signed)
Cardiology Office Note   Date:  07/26/2019   ID:  Isabel Wong, DOB 17-Jun-1950, MRN [id number]  PCP:  Deatra Graclyn Lawther, MD  Cardiologist:   Rollene Rotunda, MD  Referring:  Deatra Khristen Cheyney, MD   Chief Complaint  Patient presents with  . Hypertension  . Shortness of Breath      History of Present Illness: Isabel Wong is a 69 y.o. female who is referred by Deatra Winn Muehl, MD for evaluation of difficult to control BPs.  Since I last saw her she has done well.  She does get a little short of breath with activity such as climbing stairs.  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  She does a lot of walking.  With this she denies any chest pressure, neck or arm discomfort.  She has no palpitations, presyncope or syncope.  She had no edema.  She has had about a 10 pound weight gain.  She says her blood pressure has been very good.  Is been in the 118-120s range systolic she says her diastolics have been excellent.  She tolerates spironolactone.  Of note she had low blood pressures in the past do not see any of these.   Past Medical History:  Diagnosis Date  . Arthritis   . Hyperlipidemia   . Hypertension   . OSA (obstructive sleep apnea)    CPAP nasal    Past Surgical History:  Procedure Laterality Date  . COLONOSCOPY    . 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 CALCIUM-MAGNESIUM-VITAMIN D PO Take 5 mLs by mouth daily.     . Coenzyme Q10 (CO Q 10 PO) Take 2 capsules by mouth daily.    . Flaxseed, Linseed, (FLAX SEEDS) POWD Take 1 tablet by mouth daily.     Marland Kitchen guaiFENesin (MUCINEX) 600 MG 12 hr tablet Take 600 mg by mouth 2 (two) times daily as needed for cough or to loosen phlegm.     . MULTIPLE VITAMIN PO Take 1 tablet by mouth daily.    Marland Kitchen  spironolactone (ALDACTONE) 25 MG tablet Take 1 tablet (25 mg total) by mouth daily. 90 tablet 3  . TURMERIC PO Take 2 capsules by mouth daily.     No current facility-administered medications for this visit.    Allergies:   Chlorthalidone, Benicar [olmesartan], Hydrochlorothiazide, and Metoprolol    ROS:  Please see the history of present illness.   Otherwise, review of systems are positive for none.   All other systems are reviewed and negative.    PHYSICAL EXAM: VS:  BP (!) 148/84 (BP Location: Right Arm)   Pulse 77   Temp (!) 96.8 F (36 C)   SpO2 95%  , BMI There is no height or weight on file to calculate BMI.  GENERAL:  Well appearing NECK:  No jugular venous distention, waveform within normal limits, carotid upstroke brisk and symmetric, no bruits, no thyromegaly LUNGS:  Clear to auscultation bilaterally CHEST:  Unremarkable HEART:  PMI not displaced or sustained,S1 and S2 within normal limits, no S3, no S4, no clicks, no rubs, no murmurs ABD:  Flat, positive bowel sounds normal in frequency in pitch, no bruits, no rebound, no guarding, no midline pulsatile mass, no hepatomegaly, no splenomegaly EXT:  2 plus pulses throughout, no edema, no cyanosis no clubbing   EKG:  EKG is not ordered today.  Recent Labs: 10/27/2018: ALT 16; BUN 10; Creatinine, Ser 0.71; Potassium 4.5; Sodium 144    Lipid Panel    Component Value Date/Time   CHOL 152 10/27/2018 1025   TRIG 45 10/27/2018 1025   HDL 53 10/27/2018 1025   CHOLHDL 2.9 10/27/2018 1025   LDLCALC 90 10/27/2018 1025      Wt Readings from Last 3 Encounters:  10/27/18 158 lb 3.2 oz (71.8 kg)  07/25/18 167 lb (75.8 kg)  08/16/17 169 lb 5 oz (76.8 kg)      Other studies Reviewed: Additional studies/ records that were reviewed today include: Labs Review of the above records demonstrates:  See elsewhere   ASSESSMENT AND PLAN:  HTN:  The blood pressure is at target.  Today I will check blood work as listed below.    SOB: She does have some mild shortness of breath.  She has some risk factors.  She is interested in prevention.  I will check a CRP and a coronary calcium score.  This will determine further testing and goals of therapy.  RISK REDUCTION:   LDL/HDL ratio was OK.  No change in therapy.   COVID EDUCATION:  She has been vaccinated.    Current medicines are reviewed at length with the patient today.  The patient does not have concerns regarding medicines.  The following changes have been made:  NA  Labs/ tests ordered today include:     Orders Placed This Encounter  Procedures  . CT CARDIAC SCORING  . CBC  . Comprehensive metabolic panel  . TSH  . C-reactive protein     Disposition:   FU with me in 12 months.     Signed, Minus Breeding, MD  07/26/2019 8:24 AM    [place]

## 2019-07-26 ENCOUNTER — Ambulatory Visit: Payer: 59 | Admitting: Cardiology

## 2019-07-26 ENCOUNTER — Other Ambulatory Visit: Payer: Self-pay

## 2019-07-26 ENCOUNTER — Encounter: Payer: Self-pay | Admitting: Cardiology

## 2019-07-26 VITALS — BP 148/84 | HR 77 | Temp 96.8°F

## 2019-07-26 DIAGNOSIS — I10 Essential (primary) hypertension: Secondary | ICD-10-CM | POA: Diagnosis not present

## 2019-07-26 DIAGNOSIS — Z7189 Other specified counseling: Secondary | ICD-10-CM

## 2019-07-26 DIAGNOSIS — R0602 Shortness of breath: Secondary | ICD-10-CM

## 2019-07-26 DIAGNOSIS — R072 Precordial pain: Secondary | ICD-10-CM

## 2019-07-26 NOTE — Patient Instructions (Signed)
Medication Instructions:  NO CHANGES *If you need a refill on your cardiac medications before your next appointment, please call your pharmacy*  Lab Work: Your physician recommends that you return for lab work today (CBC, CMP, CRP, TSH) If you have labs (blood work) drawn today and your tests are completely normal, you will receive your results only by: Marland Kitchen MyChart Message (if you have MyChart) OR . A paper copy in the mail If you have any lab test that is abnormal or we need to change your treatment, we will call you to review the results.  Testing/Procedures: CT CALCIUM SCORE  Follow-Up: At [facility], you and your health needs are our priority.  As part of our continuing mission to provide you with exceptional heart care, we have created designated Provider Care Teams.  These Care Teams include your primary Cardiologist (physician) and Advanced Practice Providers (APPs -  Physician Assistants and Nurse Practitioners) who all work together to provide you with the care you need, when you need it.  We recommend signing up for the patient portal called "MyChart".  Sign up information is provided on this After Visit Summary.  MyChart is used to connect with patients for Virtual Visits (Telemedicine).  Patients are able to view lab/test results, encounter notes, upcoming appointments, etc.  Non-urgent messages can be sent to your provider as well.   To learn more about what you can do with MyChart, go to [email or web address].    Your next appointment:   12 month(s)  The format for your next appointment:   In Person  Provider:   Minus Breeding, MD  Other Instructions Dr. Percival Spanish has ordered a CT coronary calcium score. This test is done at [address]. This is $150 out of pocket. Coronary CalciumScan A coronary calcium scan is an imaging test used to look for deposits of calcium and other fatty materials (plaques) in the inner lining of the blood vessels of the  heart (coronary arteries). These deposits of calcium and plaques can partly clog and narrow the coronary arteries without producing any symptoms or warning signs. This puts a person at risk for a heart attack. This test can detect these deposits before symptoms develop. Tell a health care provider about:  Any allergies you have.  All medicines you are taking, including vitamins, herbs, eye drops, creams, and over-the-counter medicines.  Any problems you or family members have had with anesthetic medicines.  Any blood disorders you have.  Any surgeries you have had.  Any medical conditions you have.  Whether you are pregnant or may be pregnant. What are the risks? Generally, this is a safe procedure. However, problems may occur, including:  Harm to a pregnant woman and her unborn baby. This test involves the use of radiation. Radiation exposure can be dangerous to a pregnant woman and her unborn baby. If you are pregnant, you generally should not have this procedure done.  Slight increase in the risk of cancer. This is because of the radiation involved in the test. What happens before the procedure? No preparation is needed for this procedure. What happens during the procedure?  You will undress and remove any jewelry around your neck or chest.  You will put on a hospital gown.  Sticky electrodes will be placed on your chest. The electrodes will be connected to an electrocardiogram (ECG) machine to record a tracing of the electrical activity of your heart.  A CT scanner will take pictures of your heart. During  this time, you will be asked to lie still and hold your breath for 2-3 seconds while a picture of your heart is being taken. The procedure may vary among health care providers and hospitals. What happens after the procedure?  You can get dressed.  You can return to your normal activities.  It is up to you to get the results of your test. Ask your health care provider, or  the department that is doing the test, when your results will be ready. Summary  A coronary calcium scan is an imaging test used to look for deposits of calcium and other fatty materials (plaques) in the inner lining of the blood vessels of the heart (coronary arteries).  Generally, this is a safe procedure. Tell your health care provider if you are pregnant or may be pregnant.  No preparation is needed for this procedure.  A CT scanner will take pictures of your heart.  You can return to your normal activities after the scan is done. This information is not intended to replace advice given to you by your health care provider. Make sure you discuss any questions you have with your health care provider. Document Released: 08/14/2007 Document Revised: 01/05/2016 Document Reviewed: 01/05/2016 Elsevier Interactive Patient Education  2017 [employer].

## 2019-07-27 ENCOUNTER — Encounter: Payer: Self-pay | Admitting: *Deleted

## 2019-07-27 LAB — COMPREHENSIVE METABOLIC PANEL
ALT: 25 IU/L (ref 0–32)
AST: 23 IU/L (ref 0–40)
Albumin/Globulin Ratio: 1.6 (ref 1.2–2.2)
Albumin: 4.4 g/dL (ref 3.8–4.8)
Alkaline Phosphatase: 74 IU/L (ref 48–121)
BUN/Creatinine Ratio: 13 (ref 12–28)
BUN: 10 mg/dL (ref 8–27)
Bilirubin Total: 0.7 mg/dL (ref 0.0–1.2)
CO2: 22 mmol/L (ref 20–29)
Calcium: 9.9 mg/dL (ref 8.7–10.3)
Chloride: 105 mmol/L (ref 96–106)
Creatinine, Ser: 0.75 mg/dL (ref 0.57–1.00)
GFR calc Af Amer: 95 mL/min/{1.73_m2} (ref >59)
GFR calc non Af Amer: 83 mL/min/{1.73_m2} (ref >59)
Globulin, Total: 2.8 g/dL (ref 1.5–4.5)
Glucose: 84 mg/dL (ref 65–99)
Potassium: 5 mmol/L (ref 3.5–5.2)
Sodium: 141 mmol/L (ref 134–144)
Total Protein: 7.2 g/dL (ref 6.0–8.5)

## 2019-07-27 LAB — CBC
Hematocrit: 40 % (ref 34.0–46.6)
Hemoglobin: 13.6 g/dL (ref 11.1–15.9)
MCH: 31.3 pg (ref 26.6–33.0)
MCHC: 34 g/dL (ref 31.5–35.7)
MCV: 92 fL (ref 79–97)
Platelets: 211 10*3/uL (ref 150–450)
RBC: 4.34 x10E6/uL (ref 3.77–5.28)
RDW: 12.6 % (ref 11.7–15.4)
WBC: 4.2 10*3/uL (ref 3.4–10.8)

## 2019-07-27 LAB — C-REACTIVE PROTEIN: CRP: <1 mg/L (ref 0–10)

## 2019-07-27 LAB — TSH: TSH: 2.72 u[IU]/mL (ref 0.450–4.500)

## 2019-08-06 ENCOUNTER — Other Ambulatory Visit: Payer: Self-pay

## 2019-08-06 ENCOUNTER — Ambulatory Visit (INDEPENDENT_AMBULATORY_CARE_PROVIDER_SITE_OTHER)
Admission: RE | Admit: 2019-08-06 | Discharge: 2019-08-06 | Disposition: A | Discharge status: Home / Self Care | Payer: Self-pay | Source: Ambulatory Visit | Attending: Cardiology | Admitting: Cardiology

## 2019-08-06 DIAGNOSIS — R0602 Shortness of breath: Secondary | ICD-10-CM

## 2019-08-23 ENCOUNTER — Other Ambulatory Visit: Payer: Self-pay | Admitting: Cardiology

## 2020-08-18 ENCOUNTER — Other Ambulatory Visit: Payer: Self-pay

## 2020-08-18 MED ORDER — SPIRONOLACTONE 25 MG PO TABS: 1.0000 | ORAL_TABLET | Freq: Every day | ORAL | 0 refills | Status: DC

## 2020-09-03 ENCOUNTER — Telehealth: Payer: Self-pay | Admitting: Cardiology

## 2020-09-03 DIAGNOSIS — I10 Essential (primary) hypertension: Secondary | ICD-10-CM

## 2020-09-03 DIAGNOSIS — E785 Hyperlipidemia, unspecified: Secondary | ICD-10-CM

## 2020-09-03 DIAGNOSIS — R002 Palpitations: Secondary | ICD-10-CM

## 2020-09-03 DIAGNOSIS — R072 Precordial pain: Secondary | ICD-10-CM

## 2020-09-03 DIAGNOSIS — R0602 Shortness of breath: Secondary | ICD-10-CM

## 2020-09-03 NOTE — Telephone Encounter (Signed)
Spoke with patient . She has an upcoming appointment with DR Antoine Poche 10/29/20.  Patient is requestinag lab work prior to visit . She would like the same test she had last year. Which were LIPID CMP TSH C-Reactive Protein. Patient states she has not been to primary this year. "I just have not been taking good care of myself this year." RN informed patient will need to refer to Dr Piedad Climes some these labs  ( TSH and CRP ) are not standard labs ordered.  Patient verbalized understanding and states can leave a message on her voicemail when labs have been placed and  instruction on when and where to have labs done

## 2020-09-03 NOTE — Telephone Encounter (Signed)
PT is calling in to request additional blood work be scheduled for 1 yr f/u

## 2020-09-03 NOTE — Telephone Encounter (Signed)
PT is returning a call 

## 2020-09-03 NOTE — Telephone Encounter (Signed)
Spoke to patient she is aware. She is await for lab slip before  she goes to lab.  She verbalized understanding.

## 2020-09-03 NOTE — Telephone Encounter (Signed)
Left message to call back  

## 2020-10-23 LAB — C-REACTIVE PROTEIN: CRP: <1 mg/L (ref 0–10)

## 2020-10-23 LAB — LIPID PANEL
Chol/HDL Ratio: 3.5 ratio (ref 0.0–4.4)
Cholesterol, Total: 203 mg/dL — ABNORMAL HIGH (ref 100–199)
HDL: 58 mg/dL (ref >39)
LDL Chol Calc (NIH): 135 mg/dL — ABNORMAL HIGH (ref 0–99)
Triglycerides: 53 mg/dL (ref 0–149)
VLDL Cholesterol Cal: 10 mg/dL (ref 5–40)

## 2020-10-23 LAB — COMPREHENSIVE METABOLIC PANEL
ALT: 18 IU/L (ref 0–32)
AST: 19 IU/L (ref 0–40)
Albumin/Globulin Ratio: 2.1 (ref 1.2–2.2)
Albumin: 4.7 g/dL (ref 3.8–4.8)
Alkaline Phosphatase: 69 IU/L (ref 44–121)
BUN/Creatinine Ratio: 19 (ref 12–28)
BUN: 15 mg/dL (ref 8–27)
Bilirubin Total: 0.6 mg/dL (ref 0.0–1.2)
CO2: 24 mmol/L (ref 20–29)
Calcium: 9.7 mg/dL (ref 8.7–10.3)
Chloride: 104 mmol/L (ref 96–106)
Creatinine, Ser: 0.8 mg/dL (ref 0.57–1.00)
Globulin, Total: 2.2 g/dL (ref 1.5–4.5)
Glucose: 92 mg/dL (ref 65–99)
Potassium: 4.8 mmol/L (ref 3.5–5.2)
Sodium: 141 mmol/L (ref 134–144)
Total Protein: 6.9 g/dL (ref 6.0–8.5)
eGFR: 80 mL/min/{1.73_m2} (ref >59)

## 2020-10-23 LAB — TSH: TSH: 3.73 u[IU]/mL (ref 0.450–4.500)

## 2020-10-28 NOTE — Progress Notes (Signed)
Cardiology Office Note   Date:  10/29/2020   ID:  Isabel Wong, DOB [birth year], MRN [id number]  PCP:  Deatra Modest Draeger, MD  Cardiologist:   Rollene Rotunda, MD  Referring:  Deatra Kayleanna Lorman, MD   Chief Complaint  Patient presents with   Hypertension       History of Present Illness: Isabel Wong is a 70 y.o. female who is referred by Deatra Jonica Bickhart, MD for evaluation of difficult to control BPs.  Since I last saw her she had a coronary calcium score that was zero.    Since I last saw her she has done okay. The patient denies any new symptoms such as chest discomfort, neck or arm discomfort. There has been no new shortness of breath, PND or orthopnea. There have been no reported palpitations, presyncope or syncope.   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      Coenzyme Q10 (CO Q 10 PO) Take 2 capsules by mouth daily.     Flaxseed, Linseed, (FLAX SEEDS) POWD Take 1 tablet by mouth daily.      guaiFENesin (MUCINEX) 600 MG 12 hr tablet Take 600 mg by mouth 2 (two) times daily as needed for cough or to loosen phlegm.      MULTIPLE VITAMIN PO Take 1 tablet by mouth daily.     spironolactone (ALDACTONE) 25 MG tablet Take 1 tablet (25 mg total) by mouth daily. PATIENT NEED TO SCHEDULE APPOINTMENT FOR FUTURE REFILLS 90 tablet 0   TURMERIC PO Take 2 capsules by mouth daily.     No current facility-administered medications for this visit.    Allergies:   Chlorthalidone, Benicar [olmesartan], Hydrochlorothiazide, and  Metoprolol    ROS:  Please see the history of present illness.   Otherwise, review of systems are positive for none.   All other systems are reviewed and negative.    PHYSICAL EXAM: VS:  BP 124/76   Pulse 77   Resp 20   Ht 5\' 3"  (1.6 m)   Wt 174 lb 6.4 oz (79.1 kg)   SpO2 98%   BMI 30.89 kg/m  , BMI Body mass index is 30.89 kg/m.  GENERAL:  Well appearing NECK:  No jugular venous distention, waveform within normal limits, carotid upstroke brisk and symmetric, no bruits, no thyromegaly LUNGS:  Clear to auscultation bilaterally CHEST:  Unremarkable HEART:  PMI not displaced or sustained,S1 and S2 within normal limits, no S3, no S4, no clicks, no rubs, no murmurs ABD:  Flat, positive bowel sounds normal in frequency in pitch, no bruits, no rebound, no guarding, no midline pulsatile mass, no hepatomegaly, no splenomegaly EXT:  2 plus pulses throughout, no edema, no cyanosis no clubbing  EKG:  EKG is  ordered today. Normal sinus rhythm, rate 77, axis within normal limits, intervals within normal limits, no acute ST-T wave changes.   Recent Labs: 10/22/2020: ALT 18; BUN 15; Creatinine, Ser 0.80; Potassium 4.8; Sodium 141; TSH 3.730  Lipid Panel    Component Value Date/Time   CHOL 203 (H) 10/22/2020 0819   TRIG 53 10/22/2020 0819   HDL 58 10/22/2020 0819   CHOLHDL 3.5 10/22/2020 0819   LDLCALC 135 (H) 10/22/2020 0819      Wt Readings from Last 3 Encounters:  10/29/20 174 lb 6.4 oz (79.1 kg)  10/27/18 158 lb 3.2 oz (71.8 kg)  07/25/18 167 lb (75.8 kg)      Other studies Reviewed: Additional studies/ records that were reviewed today include: None Review of the above records demonstrates:   NA   ASSESSMENT AND PLAN:  HTN:  The blood pressure is at target.  No change in therapy.  She is up-to-date with blood work.   She really wants 1 year follow-up.   SOB:    This is no longer an issue.  She states she is breathing well.  RISK REDUCTION:   LDL/HDL ratio was not as  good as last year.  Her LDL was up to 135.  She has been eating peanut butter and old-fashioned popcorn.  She is going to look at her diet.  She does not need a statin and has been sensitive to medications.     Current medicines are reviewed at length with the patient today.  The patient does not have concerns regarding medicines.  The following changes have been made:  None  Labs/ tests ordered today include:   None  Orders Placed This Encounter  Procedures   EKG 12-Lead      Disposition:   FU with me in 12 months.   (She requests yearly follow-up.)   Signed, Rollene Rotunda, MD  10/29/2020 8:47 AM    [facility]

## 2020-10-29 ENCOUNTER — Encounter: Payer: Self-pay | Admitting: Cardiology

## 2020-10-29 ENCOUNTER — Other Ambulatory Visit: Payer: Self-pay

## 2020-10-29 ENCOUNTER — Ambulatory Visit: Payer: 59 | Admitting: Cardiology

## 2020-10-29 VITALS — BP 124/76 | HR 77 | Resp 20 | Ht 63.0 in | Wt 174.4 lb

## 2020-10-29 DIAGNOSIS — I10 Essential (primary) hypertension: Secondary | ICD-10-CM

## 2020-10-29 DIAGNOSIS — R0602 Shortness of breath: Secondary | ICD-10-CM

## 2020-10-29 NOTE — Patient Instructions (Signed)
Medication Instructions:   No changes   *If you need a refill on your cardiac medications before your next appointment, please call your pharmacy*   Lab Work:  Not needed     Testing/Procedures:  Not needed  Follow-Up: At [facility], you and your health needs are our priority.  As part of our continuing mission to provide you with exceptional heart care, we have created designated Provider Care Teams.  These Care Teams include your primary Cardiologist (physician) and Advanced Practice Providers (APPs -  Physician Assistants and Nurse Practitioners) who all work together to provide you with the care you need, when you need it.  We recommend signing up for the patient portal called "MyChart".  Sign up information is provided on this After Visit Summary.  MyChart is used to connect with patients for Virtual Visits (Telemedicine).  Patients are able to view lab/test results, encounter notes, upcoming appointments, etc.  Non-urgent messages can be sent to your provider as well.   To learn more about what you can do with MyChart, go to [email or web address].    Your next appointment:   12 month(s)  The format for your next appointment:   In Person  Provider:   James Hochrein, MD     

## 2020-11-13 ENCOUNTER — Other Ambulatory Visit: Payer: Self-pay | Admitting: Cardiology

## 2020-12-18 ENCOUNTER — Other Ambulatory Visit: Payer: Self-pay | Admitting: Obstetrics & Gynecology

## 2020-12-18 DIAGNOSIS — Z1231 Encounter for screening mammogram for malignant neoplasm of breast: Secondary | ICD-10-CM

## 2020-12-19 ENCOUNTER — Ambulatory Visit: Payer: 59

## 2021-01-16 ENCOUNTER — Ambulatory Visit
Admission: RE | Admit: 2021-01-16 | Discharge: 2021-01-16 | Disposition: A | Discharge status: Home / Self Care | Payer: 59 | Source: Ambulatory Visit | Attending: Obstetrics & Gynecology | Admitting: Obstetrics & Gynecology

## 2021-01-16 ENCOUNTER — Other Ambulatory Visit: Payer: Self-pay

## 2021-01-16 DIAGNOSIS — Z1231 Encounter for screening mammogram for malignant neoplasm of breast: Secondary | ICD-10-CM

## 2021-11-16 ENCOUNTER — Other Ambulatory Visit: Payer: Self-pay | Admitting: Cardiology

## 2021-11-18 ENCOUNTER — Encounter: Payer: Self-pay | Admitting: Cardiology

## 2021-11-18 NOTE — Telephone Encounter (Signed)
error 

## 2021-11-24 ENCOUNTER — Telehealth: Payer: Self-pay | Admitting: Cardiology

## 2021-11-24 ENCOUNTER — Other Ambulatory Visit: Payer: Self-pay

## 2021-11-24 DIAGNOSIS — I10 Essential (primary) hypertension: Secondary | ICD-10-CM

## 2021-11-24 DIAGNOSIS — E785 Hyperlipidemia, unspecified: Secondary | ICD-10-CM

## 2021-11-24 NOTE — Progress Notes (Unsigned)
Cardiology Office Note   Date:  11/26/2021   ID:  Isabel Wong, DOB Sep 26, 1950, MRN [id number]  PCP:  Donald Prose, MD  Cardiologist:   Minus Breeding, MD  Referring:  Donald Prose, MD   Chief Complaint  Patient presents with   Hypertension       History of Present Illness: Isabel Wong is a 71 y.o. female who is referred by Donald Prose, MD for evaluation of difficult to control BPs.  Since I last saw her she had a coronary calcium score that was zero.    Since I last saw her she has retired.  She has a 18-week-old granddaughter which is her first.   The patient denies any new symptoms such as chest discomfort, neck or arm discomfort. There has been no new shortness of breath, PND or orthopnea. There have been no reported palpitations, presyncope or syncope.  She does some exercising in her house including aerobic and stretching.   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      Coenzyme Q10 (CO Q 10 PO) Take 2 capsules by mouth daily.     Flaxseed, Linseed, (FLAX SEEDS) POWD Take 1 tablet by mouth daily.      guaiFENesin (MUCINEX) 600 MG 12 hr tablet Take 600 mg by mouth 2 (two) times daily as needed for cough or to loosen phlegm.      MULTIPLE VITAMIN PO Take 1 tablet by mouth daily.     spironolactone (ALDACTONE) 25 MG tablet Take 1 tablet (25 mg total) by mouth daily. SCHEDULE OFFICE VISIT FOR FUTURE REFILLS 30 tablet 0   TURMERIC PO Take 2 capsules by mouth daily.     No current  facility-administered medications for this visit.    Allergies:   Chlorthalidone, Benicar [olmesartan], Hydrochlorothiazide, and Metoprolol    ROS:  Please see the history of present illness.   Otherwise, review of systems are positive for none.   All other systems are reviewed and negative.    PHYSICAL EXAM: VS:  BP 124/72   Pulse 76   Ht 5\' 2"  (1.575 m)   Wt 170 lb 12.8 oz (77.5 kg)   SpO2 98%   BMI 31.24 kg/m  , BMI Body mass index is 31.24 kg/m.  GENERAL:  Well appearing NECK:  No jugular venous distention, waveform within normal limits, carotid upstroke brisk and symmetric, no bruits, no thyromegaly LUNGS:  Clear to auscultation bilaterally CHEST:  Unremarkable HEART:  PMI not displaced or sustained,S1 and S2 within normal limits, no S3, no S4, no clicks, no rubs, no murmurs ABD:  Flat, positive bowel sounds normal in frequency in pitch, no bruits, no rebound, no guarding, no midline pulsatile mass, no hepatomegaly, no splenomegaly EXT:  2 plus pulses throughout, no edema, no cyanosis no clubbing   EKG:  EKG is  ordered today. Normal sinus rhythm, rate 76, axis within normal limits, intervals within normal limits, no acute ST-T  wave changes.   Recent Labs: 11/25/2021: ALT 21    Lipid Panel    Component Value Date/Time   CHOL 207 (H) 11/25/2021 0900   TRIG 48 11/25/2021 0900   HDL 49 11/25/2021 0900   CHOLHDL 4.2 11/25/2021 0900   LDLCALC 149 (H) 11/25/2021 0900      Wt Readings from Last 3 Encounters:  11/26/21 170 lb 12.8 oz (77.5 kg)  10/29/20 174 lb 6.4 oz (79.1 kg)  10/27/18 158 lb 3.2 oz (71.8 kg)      Other studies Reviewed: Additional studies/ records that were reviewed today include: Labs Review of the above records demonstrates:   See elsewhere   ASSESSMENT AND PLAN:  HTN:  The blood pressure is at target.  Unfortunately we do some blood work but I do not see a chemistry and we will have to draw this today.  She will remain on the  spironolactone.  RISK REDUCTION:   She did have an LDL drawn yesterday and unfortunately her cholesterol is gone up with an LDL of 149 when it was 135.  She has 0 calcium score 1 think she needs a statin but we talked at great length about diet.  She is going to work on this.     Current medicines are reviewed at length with the patient today.  The patient does not have concerns regarding medicines.  The following changes have been made: None  Labs/ tests ordered today include:     Orders Placed This Encounter  Procedures   Basic metabolic panel   EKG [id number]      Disposition:   FU with me in 12 months.      Signed, Minus Breeding, MD  11/26/2021 9:38 AM    [place]

## 2021-11-24 NOTE — Telephone Encounter (Signed)
Spoke to patient she wants to have lipid panel before her appointment with Dr.Hochrein 9/28.Advised I will place orders for a lipid panel and cmet to be done tomorrow 9/27.No lab appointment needed.Advised to come fasting.Lab opens 8:00 am to 12:00 noon.Orders placed.

## 2021-11-24 NOTE — Telephone Encounter (Signed)
Patient is requesting an order for a lipid panel--plans to have it drawn on the same day as 9/28 appointment with Dr. Percival Spanish

## 2021-11-26 ENCOUNTER — Encounter: Payer: Self-pay | Admitting: Cardiology

## 2021-11-26 ENCOUNTER — Ambulatory Visit: Payer: Medicare Other | Attending: Cardiology | Admitting: Cardiology

## 2021-11-26 VITALS — BP 124/72 | HR 76 | Ht 62.0 in | Wt 170.8 lb

## 2021-11-26 DIAGNOSIS — R0602 Shortness of breath: Secondary | ICD-10-CM | POA: Diagnosis not present

## 2021-11-26 DIAGNOSIS — I10 Essential (primary) hypertension: Secondary | ICD-10-CM | POA: Diagnosis not present

## 2021-11-26 LAB — LIPID PANEL
Chol/HDL Ratio: 4.2 ratio (ref 0.0–4.4)
Cholesterol, Total: 207 mg/dL — ABNORMAL HIGH (ref 100–199)
HDL: 49 mg/dL (ref >39)
LDL Chol Calc (NIH): 149 mg/dL — ABNORMAL HIGH (ref 0–99)
Triglycerides: 48 mg/dL (ref 0–149)
VLDL Cholesterol Cal: 9 mg/dL (ref 5–40)

## 2021-11-26 LAB — HEPATIC FUNCTION PANEL
ALT: 21 IU/L (ref 0–32)
AST: 17 IU/L (ref 0–40)
Albumin: 4.4 g/dL (ref 3.9–4.9)
Alkaline Phosphatase: 58 IU/L (ref 44–121)
Bilirubin Total: 0.3 mg/dL (ref 0.0–1.2)
Bilirubin, Direct: <0.1 mg/dL (ref 0.00–0.40)
Total Protein: 6.9 g/dL (ref 6.0–8.5)

## 2021-11-26 MED ORDER — SPIRONOLACTONE 25 MG PO TABS: 25.0000 mg | ORAL_TABLET | Freq: Every day | ORAL | 3 refills | Status: DC

## 2021-11-26 NOTE — Patient Instructions (Signed)
Medication Instructions:  NO CHANGES today   *If you need a refill on your cardiac medications before your next appointment, please call your pharmacy*   Lab Work: BMET - non-fasting  If you have labs (blood work) drawn today and your tests are completely normal, you will receive your results only by: [place] (if you have MyChart) OR A paper copy in the mail If you have any lab test that is abnormal or we need to change your treatment, we will call you to review the results.   Testing/Procedures: NONE   Follow-Up: At [facility], you and your health needs are our priority.  As part of our continuing mission to provide you with exceptional heart care, we have created designated Provider Care Teams.  These Care Teams include your primary Cardiologist (physician) and Advanced Practice Providers (APPs -  Physician Assistants and Nurse Practitioners) who all work together to provide you with the care you need, when you need it.  We recommend signing up for the patient portal called "MyChart".  Sign up information is provided on this After Visit Summary.  MyChart is used to connect with patients for Virtual Visits (Telemedicine).  Patients are able to view lab/test results, encounter notes, upcoming appointments, etc.  Non-urgent messages can be sent to your provider as well.   To learn more about what you can do with MyChart, go to [email or web address].    Your next appointment:   12 month(s)  The format for your next appointment:   In Person  Provider:   Minus Breeding, MD

## 2021-11-26 NOTE — Addendum Note (Signed)
Addended by: Fidel Levy on: 11/26/2021 09:41 AM   Modules accepted: Orders

## 2021-12-16 ENCOUNTER — Encounter: Payer: Self-pay | Admitting: *Deleted

## 2021-12-30 ENCOUNTER — Telehealth: Payer: Self-pay | Admitting: Cardiology

## 2021-12-30 NOTE — Telephone Encounter (Signed)
Spoke with the patient and advised her that she needs a BMET drawn. She has not had this done at another facility. She thought this was supposed to be added on to the labs that she had on 9/27, however it does not look like this happened. Patient will come by for lab work on Friday.

## 2021-12-30 NOTE — Telephone Encounter (Signed)
Pt is requesting call back to discuss letter she received in mail about needing to come back in for more lab work. She states that she believes she has already had this lab work done and would like a call back to discuss.

## 2022-01-01 LAB — BASIC METABOLIC PANEL WITH GFR
BUN/Creatinine Ratio: 22 (ref 12–28)
BUN: 16 mg/dL (ref 8–27)
CO2: 22 mmol/L (ref 20–29)
Calcium: 9.3 mg/dL (ref 8.7–10.3)
Chloride: 106 mmol/L (ref 96–106)
Creatinine, Ser: 0.74 mg/dL (ref 0.57–1.00)
Glucose: 94 mg/dL (ref 70–99)
Potassium: 4.4 mmol/L (ref 3.5–5.2)
Sodium: 140 mmol/L (ref 134–144)
eGFR: 87 mL/min/1.73

## 2022-01-04 ENCOUNTER — Encounter: Payer: Self-pay | Admitting: *Deleted

## 2022-12-05 ENCOUNTER — Other Ambulatory Visit: Payer: Self-pay | Admitting: Cardiology

## 2022-12-13 ENCOUNTER — Other Ambulatory Visit: Payer: Self-pay | Admitting: Obstetrics & Gynecology

## 2022-12-13 ENCOUNTER — Telehealth: Payer: Self-pay | Admitting: Cardiology

## 2022-12-13 DIAGNOSIS — Z1231 Encounter for screening mammogram for malignant neoplasm of breast: Secondary | ICD-10-CM

## 2022-12-13 NOTE — Telephone Encounter (Signed)
*  STAT* If patient is at the pharmacy, call can be transferred to refill team.   1. Which medications need to be refilled? (please list name of each medication and dose if known)  spironolactone (ALDACTONE) 25 MG tablet    2. Would you like to learn more about the convenience, safety, & potential cost savings by using the [facility]?    3. Are you open to using the [facility] (Type [facility]. ).   4. Which pharmacy/location (including street and city if local pharmacy) is medication to be sent to? [id number] - [place], [place] - [address]    5. Do they need a 30 day or 90 day supply? 90 day  Patient has appt scheduled for December 12th.  Patient only has two days left.

## 2022-12-14 ENCOUNTER — Telehealth: Payer: Self-pay

## 2022-12-14 MED ORDER — SPIRONOLACTONE 25 MG PO TABS: 25.0000 mg | ORAL_TABLET | Freq: Every day | ORAL | 0 refills | Status: DC

## 2022-12-14 NOTE — Telephone Encounter (Signed)
Patient requested refill on medication spironolactone patient recently obtain a 90 day supply earlier this month October too soon to refill

## 2022-12-21 ENCOUNTER — Ambulatory Visit
Admission: RE | Admit: 2022-12-21 | Discharge: 2022-12-21 | Disposition: A | Discharge status: Home / Self Care | Payer: Medicare Other | Source: Ambulatory Visit | Attending: Obstetrics & Gynecology | Admitting: Obstetrics & Gynecology

## 2022-12-21 DIAGNOSIS — Z1231 Encounter for screening mammogram for malignant neoplasm of breast: Secondary | ICD-10-CM

## 2023-01-08 ENCOUNTER — Emergency Department (HOSPITAL_BASED_OUTPATIENT_CLINIC_OR_DEPARTMENT_OTHER)
Admission: EM | Admit: 2023-01-08 | Discharge: 2023-01-08 | Disposition: A | Discharge status: Home / Self Care | Payer: Medicare Other | Attending: Emergency Medicine | Admitting: Emergency Medicine

## 2023-01-08 ENCOUNTER — Encounter (HOSPITAL_BASED_OUTPATIENT_CLINIC_OR_DEPARTMENT_OTHER): Payer: Self-pay

## 2023-01-08 DIAGNOSIS — T7840XA Allergy, unspecified, initial encounter: Secondary | ICD-10-CM | POA: Insufficient documentation

## 2023-01-08 MED ORDER — PREDNISONE 20 MG PO TABS: 40.0000 mg | ORAL_TABLET | Freq: Every day | ORAL | 0 refills | Status: AC

## 2023-01-08 MED ORDER — FAMOTIDINE 20 MG PO TABS: 20.0000 mg | ORAL_TABLET | Freq: Two times a day (BID) | ORAL | 0 refills | Status: AC

## 2023-01-08 NOTE — ED Triage Notes (Signed)
She c/o some swelling around her mouth and lips. She states she is "reacting to some [place] jerky that I ate three days ago". My Isabel Wong doctor said I should come here to be checked". She denies any sensation of tongue or throat swelling, and she is breathing normally. She states she has been taking "a non-drowsy antihistamine in the daytime and Benadryl at night".

## 2023-01-08 NOTE — ED Provider Notes (Signed)
[name] Provider Note   CSN: [id number] Arrival date & time: 01/08/23  1120     History  Chief Complaint  Patient presents with   Allergic Reaction    Isabel Wong is a 72 y.o. female.  Patient to ED with symptoms that started as facial swelling and a raised rash to bilateral perioral areas 4 days ago after eating a [place] jerky she had not had before. No rash anywhere else. No tongue swelling or fullness in the throat. No SOB, wheezing. No nausea, vomiting. The last time she ate the jerky was 4 days ago and she continues to have recurrent facial symptoms. She is taking a nondrowsy antihistamine in the day time and Benadryl at night without significant relief. She also reports using a new facial make-up foundation 5 days ago but had no immediate reaction to it.   The history is provided by the patient. No language interpreter was used.  Allergic Reaction      Home Medications Prior to Admission medications   Medication Sig Start Date End Date Taking? Authorizing Provider  famotidine (PEPCID) 20 MG tablet Take 1 tablet (20 mg total) by mouth 2 (two) times daily. 01/08/23  Yes Vetta Couzens, Melvenia Beam, PA-C  predniSONE (DELTASONE) 20 MG tablet Take 2 tablets (40 mg total) by mouth daily. 01/08/23  Yes Maclane Holloran, PA-C  CALCIUM-MAGNESIUM-VITAMIN D PO Take 5 mLs by mouth daily.     [provider]  Coenzyme Q10 (CO Q 10 PO) Take 2 capsules by mouth daily.    [provider]  Flaxseed, Linseed, (FLAX SEEDS) POWD Take 1 tablet by mouth daily.     [provider]  guaiFENesin (MUCINEX) 600 MG 12 hr tablet Take 600 mg by mouth 2 (two) times daily as needed for cough or to loosen phlegm.     [provider]  MULTIPLE VITAMIN PO Take 1 tablet by mouth daily.    [provider]  spironolactone (ALDACTONE) 25 MG tablet Take 1 tablet (25 mg total) by mouth daily. KEEP OV. 12/14/22   Rollene Rotunda, MD   TURMERIC PO Take 2 capsules by mouth daily.    [provider]      Allergies    Chlorthalidone, Benicar [olmesartan], Hydrochlorothiazide, and Metoprolol    Review of Systems   Review of Systems  Physical Exam Updated Vital Signs BP (!) 158/92   Pulse 96   Temp 97.6 F (36.4 C)   Resp 16   SpO2 100%  Physical Exam Vitals and nursing note reviewed.  Constitutional:      Appearance: Normal appearance.  HENT:     Head: Normocephalic.     Nose: Nose normal.     Mouth/Throat:     Mouth: Mucous membranes are moist.     Comments: No intraoral swelling. Eyes:     Pupils: Pupils are equal, round, and reactive to light.     Comments: Nevus of Ota affecting right eye.   Cardiovascular:     Rate and Rhythm: Normal rate.  Pulmonary:     Effort: Pulmonary effort is normal.     Breath sounds: Normal breath sounds. No stridor. No wheezing, rhonchi or rales.  Musculoskeletal:        General: Normal range of motion.     Cervical back: Normal range of motion and neck supple.  Skin:    General: Skin is warm and dry.     Findings: No rash.  Neurological:  Mental Status: She is alert and oriented to person, place, and time.     ED Results / Procedures / Treatments   Labs (all labs ordered are listed, but only abnormal results are displayed) Labs Reviewed - No data to display  EKG None  Radiology No results found.  Procedures Procedures    Medications Ordered in ED Medications - No data to display  ED Course/ Medical Decision Making/ A&P Clinical Course as of 01/08/23 1430  Sat Jan 08, 2023  1426 Patient to ED with symptoms of mild allergic reaction, likely to new food but unclear. Will add short course of prednisone and Pepcid to antihistamine she is already taking. Refer to allergist.  [SU]    Clinical Course User Index [SU] Elpidio Anis, PA-C                                 Medical Decision Making          Final Clinical Impression(s) /  ED Diagnoses Final diagnoses:  Allergic reaction, initial encounter    Rx / DC Orders ED Discharge Orders          Ordered    predniSONE (DELTASONE) 20 MG tablet  Daily        01/08/23 1428    famotidine (PEPCID) 20 MG tablet  2 times daily        01/08/23 1428              Elpidio Anis, PA-C 01/08/23 1430    Rolan Bucco, MD 01/09/23 0730

## 2023-01-08 NOTE — Discharge Instructions (Addendum)
Take medications as prescribed for the next 3 days. You can continue the antihistamine use as you have been.  Follow up with an allergist of your choice for further evaluation.

## 2023-01-17 ENCOUNTER — Telehealth: Payer: Self-pay | Admitting: Cardiology

## 2023-01-17 NOTE — Telephone Encounter (Signed)
Patient says that someone had called her and she was returning phone call

## 2023-01-18 NOTE — Telephone Encounter (Signed)
 Unable to reach patient. Left voicemail to return call

## 2023-02-09 NOTE — Progress Notes (Addendum)
  Cardiology Office Note:   Date:  02/10/2023  ID:  Isabel Wong, DOB [birth year], MRN [id number] PCP: Deatra Marten Iles, MD  [facility] Providers Cardiologist:  Rollene Rotunda, MD {  History of Present Illness:   Isabel Wong is a 71 y.o. female who is referred by Deatra Chanda Laperle, MD for evaluation of difficult to control BPs.  Since I last saw her she had a coronary calcium score that was zero.     Since I last saw her her granddaughter is turning 16 months old and she has had another grandchild.  She is actually taking care of her first granddaughter all day long while her children work.  The patient denies any new symptoms such as chest discomfort, neck or arm discomfort. There has been no new shortness of breath, PND or orthopnea. There have been no reported palpitations, presyncope or syncope.   ROS: As stated in the HPI and negative for all other systems.  Studies Reviewed:    EKG:   EKG Interpretation Date/Time:  Thursday February 10 2023 16:51:41 EST Ventricular Rate:  93 PR Interval:  144 QRS Duration:  78 QT Interval:  350 QTC Calculation: 435 R Axis:   58  Text Interpretation: Normal sinus rhythm Normal ECG Confirmed by Rollene Rotunda ([id number]) on 02/10/2023 5:39:29 PM   Risk Assessment/Calculations:              Physical Exam:   VS:  BP 119/83 (BP Location: Left Arm, Patient Position: Sitting, Cuff Size: Normal)   Pulse 93   Ht 5\' 3"  (1.6 m)   Wt 160 lb (72.6 kg)   SpO2 96%   BMI 28.34 kg/m    Wt Readings from Last 3 Encounters:  02/10/23 160 lb (72.6 kg)  11/26/21 170 lb 12.8 oz (77.5 kg)  10/29/20 174 lb 6.4 oz (79.1 kg)     GEN: Well nourished, well developed in no acute distress NECK: No JVD; No carotid bruits CARDIAC: RRR, no murmurs, rubs, gallops RESPIRATORY:  Clear to auscultation without rales, wheezing or rhonchi  ABDOMEN: Soft, non-tender, non-distended EXTREMITIES:  No edema; No deformity   ASSESSMENT AND PLAN:   HTN:  The blood pressure is  at target.  No change in therapy.  Call Ms. Pfohl with the results and send results to Deatra Jyasia Markoff, MD  DYSLIPIDEMIA: Her LDL is 149 49.  She has a calcium score of 0.  It has been a couple of years since her last lipid profile and I will have her come back for lipids.       Follow up with me in one year.   Signed, Rollene Rotunda, MD

## 2023-02-10 ENCOUNTER — Ambulatory Visit: Payer: Medicare Other | Attending: Cardiology | Admitting: Cardiology

## 2023-02-10 ENCOUNTER — Encounter: Payer: Self-pay | Admitting: Cardiology

## 2023-02-10 VITALS — BP 119/83 | HR 93 | Ht 63.0 in | Wt 160.0 lb

## 2023-02-10 DIAGNOSIS — E785 Hyperlipidemia, unspecified: Secondary | ICD-10-CM | POA: Diagnosis present

## 2023-02-10 DIAGNOSIS — I10 Essential (primary) hypertension: Secondary | ICD-10-CM | POA: Diagnosis present

## 2023-02-10 NOTE — Patient Instructions (Signed)
Medication Instructions:  No changes.  *If you need a refill on your cardiac medications before your next appointment, please call your pharmacy*   Lab Work: Fasting Lipid, BMET and Magnesium at your earliest convenience.  If you have labs (blood work) drawn today and your tests are completely normal, you will receive your results only by: MyChart Message (if you have MyChart) OR A paper copy in the mail If you have any lab test that is abnormal or we need to change your treatment, we will call you to review the results.   Follow-Up: At [facility], you and your health needs are our priority.  As part of our continuing mission to provide you with exceptional heart care, we have created designated Provider Care Teams.  These Care Teams include your primary Cardiologist (physician) and Advanced Practice Providers (APPs -  Physician Assistants and Nurse Practitioners) who all work together to provide you with the care you need, when you need it.  We recommend signing up for the patient portal called "MyChart".  Sign up information is provided on this After Visit Summary.  MyChart is used to connect with patients for Virtual Visits (Telemedicine).  Patients are able to view lab/test results, encounter notes, upcoming appointments, etc.  Non-urgent messages can be sent to your provider as well.   To learn more about what you can do with MyChart, go to [email or web address].    Your next appointment:   12 month(s)  Provider:   Rollene Rotunda, MD

## 2023-02-16 LAB — BASIC METABOLIC PANEL
BUN/Creatinine Ratio: 19 (ref 12–28)
BUN: 14 mg/dL (ref 8–27)
CO2: 21 mmol/L (ref 20–29)
Calcium: 9.3 mg/dL (ref 8.7–10.3)
Chloride: 105 mmol/L (ref 96–106)
Creatinine, Ser: 0.73 mg/dL (ref 0.57–1.00)
Glucose: 100 mg/dL — ABNORMAL HIGH (ref 70–99)
Potassium: 4.8 mmol/L (ref 3.5–5.2)
Sodium: 139 mmol/L (ref 134–144)
eGFR: 88 mL/min/{1.73_m2} (ref >59)

## 2023-02-16 LAB — LIPID PANEL
Chol/HDL Ratio: 3.6 {ratio} (ref 0.0–4.4)
Cholesterol, Total: 177 mg/dL (ref 100–199)
HDL: 49 mg/dL (ref >39)
LDL Chol Calc (NIH): 120 mg/dL — ABNORMAL HIGH (ref 0–99)
Triglycerides: 39 mg/dL (ref 0–149)
VLDL Cholesterol Cal: 8 mg/dL (ref 5–40)

## 2023-02-16 LAB — MAGNESIUM: Magnesium: 2.2 mg/dL (ref 1.6–2.3)

## 2023-03-30 ENCOUNTER — Telehealth: Payer: Self-pay | Admitting: Cardiology

## 2023-03-30 NOTE — Telephone Encounter (Signed)
Letter completed and placed in mail out box

## 2023-03-30 NOTE — Telephone Encounter (Signed)
Pt called in asking that her most recent lab results from 02/16/23 to be mailed to her. She confirmed the address in her chart is correct.

## 2023-06-05 ENCOUNTER — Other Ambulatory Visit: Payer: Self-pay | Admitting: Cardiology

## 2023-11-02 IMAGING — MG MM DIGITAL SCREENING BILAT W/ TOMO AND CAD
8 series · 8 of 24 positions shown · non-contrast
Comparison: Previous exam(s).

CLINICAL DATA: Screening.

EXAM:
DIGITAL SCREENING BILATERAL MAMMOGRAM WITH TOMOSYNTHESIS AND CAD
TECHNIQUE: Bilateral screening digital craniocaudal and mediolateral oblique
mammograms were obtained. Bilateral screening digital breast
tomosynthesis was performed. The images were evaluated with
computer-aided detection.

[L MLO synth-2D]
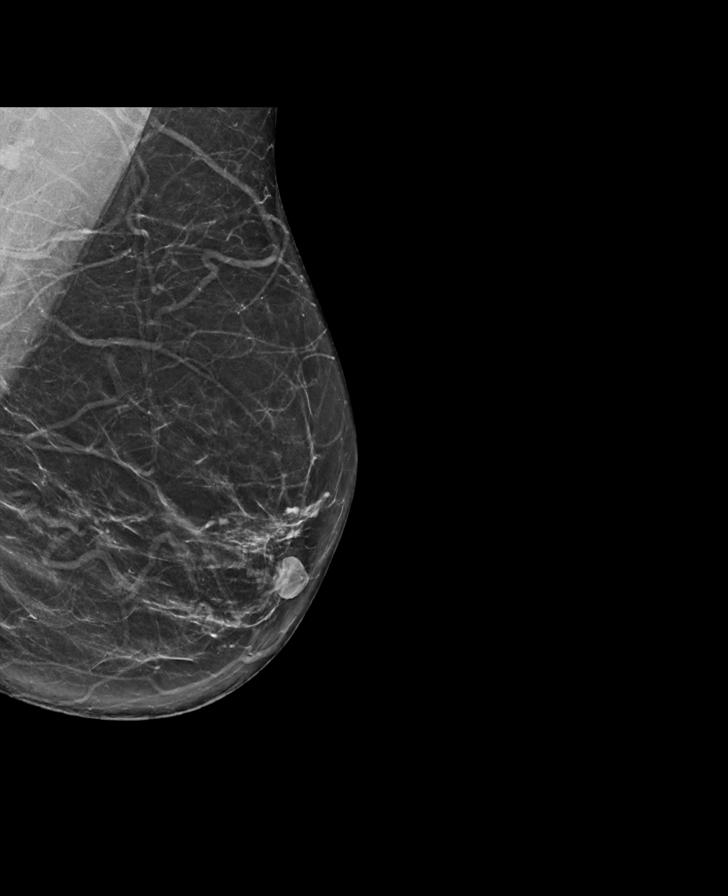

[L CC synth-2D]
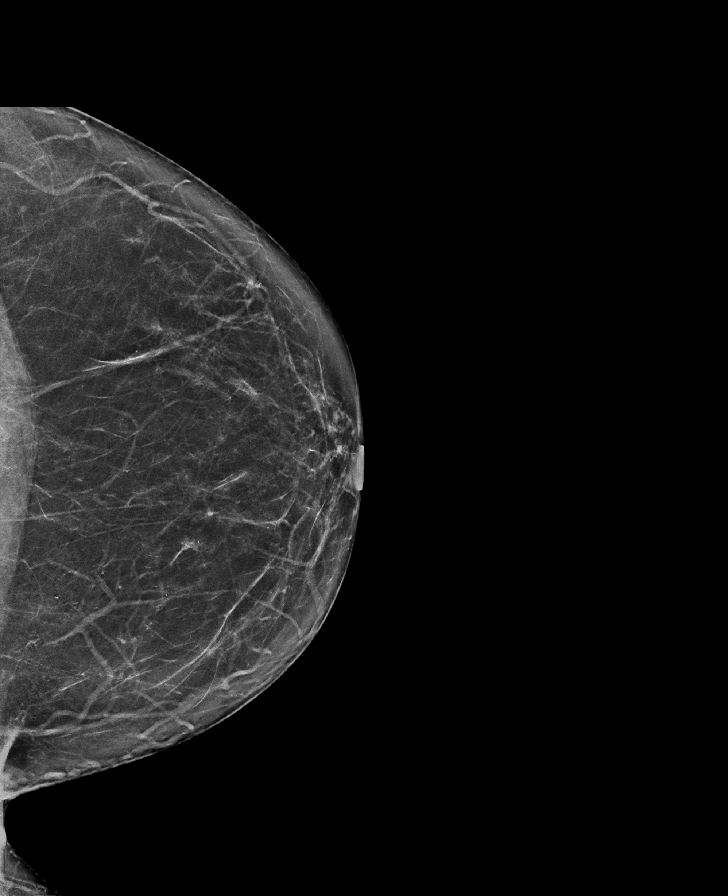

[R MLO synth-2D]
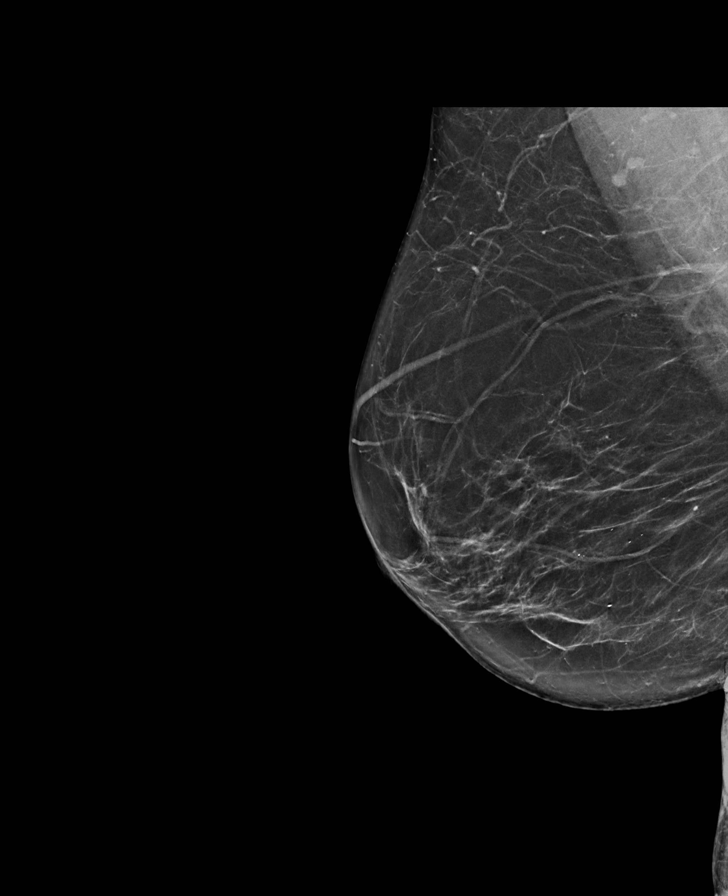

[R CC synth-2D]
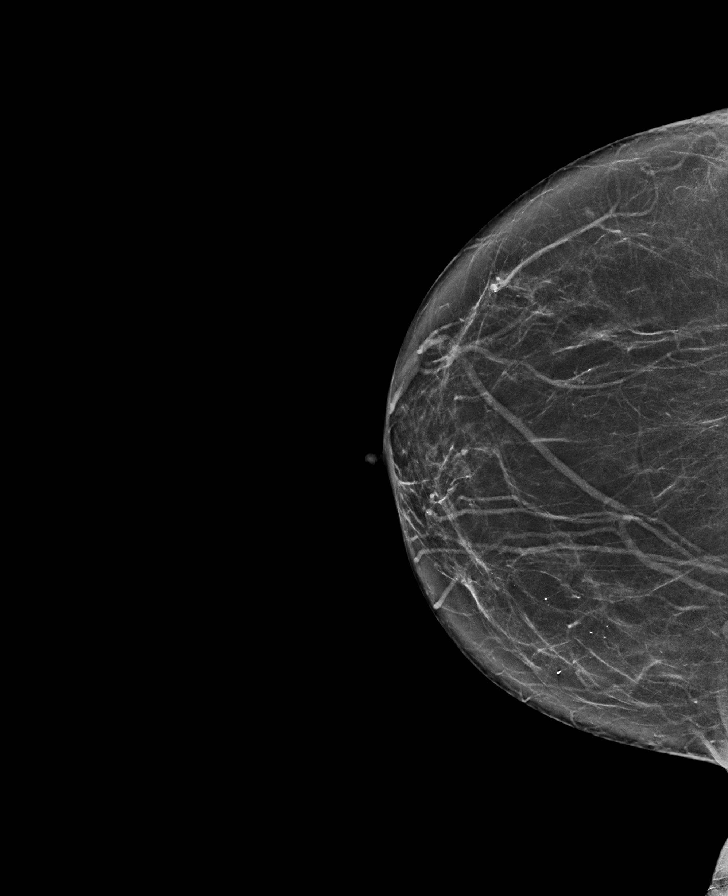

[R CC tomo · tomo slice 36/71.0]
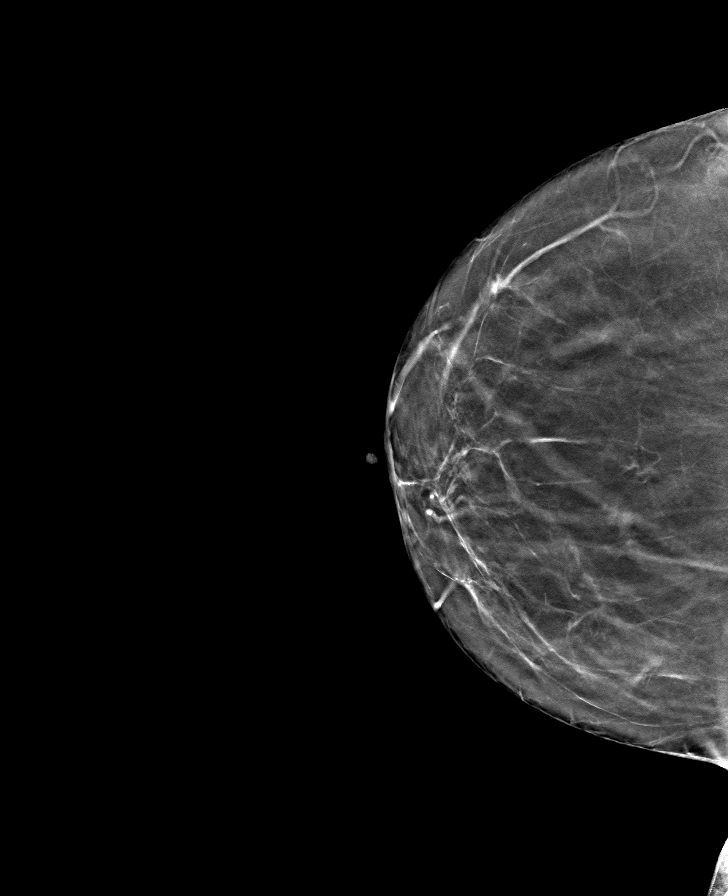

[L CC tomo · tomo slice 38/75.0]
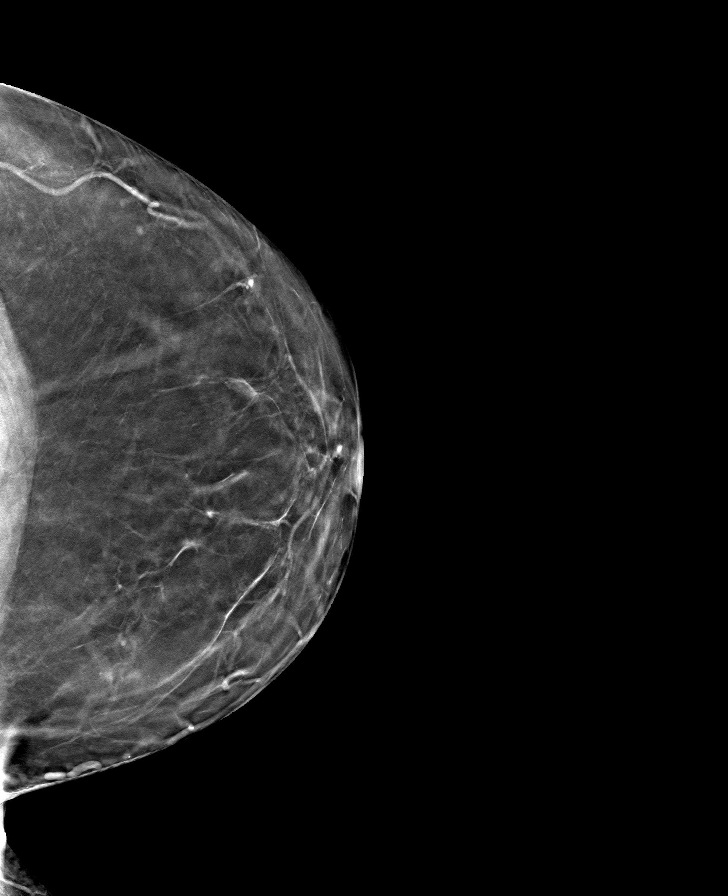

[L MLO tomo · tomo slice 38/75.0]
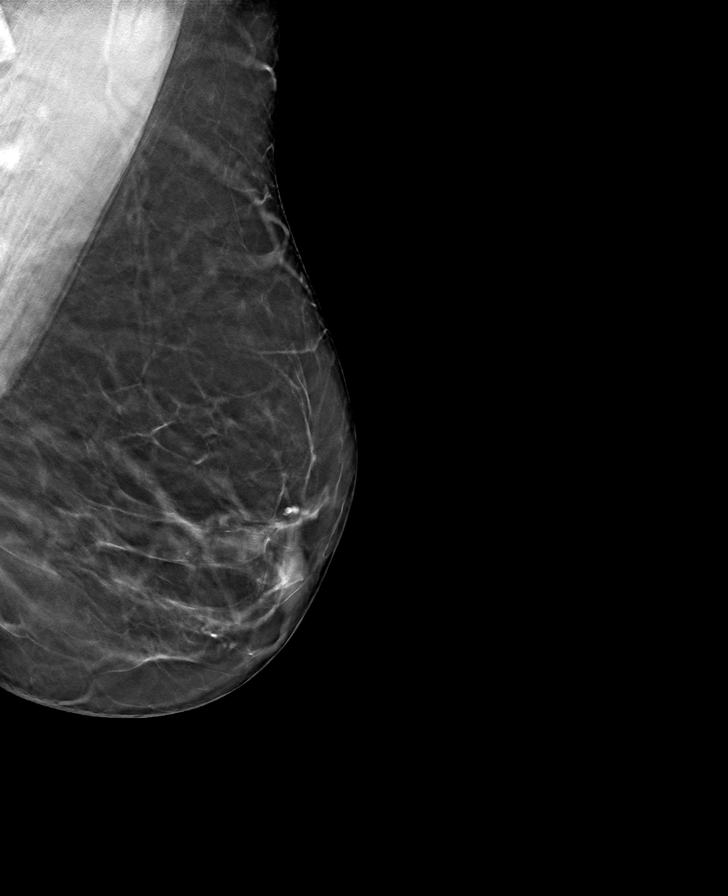

[R MLO tomo · tomo slice 39/78.0]
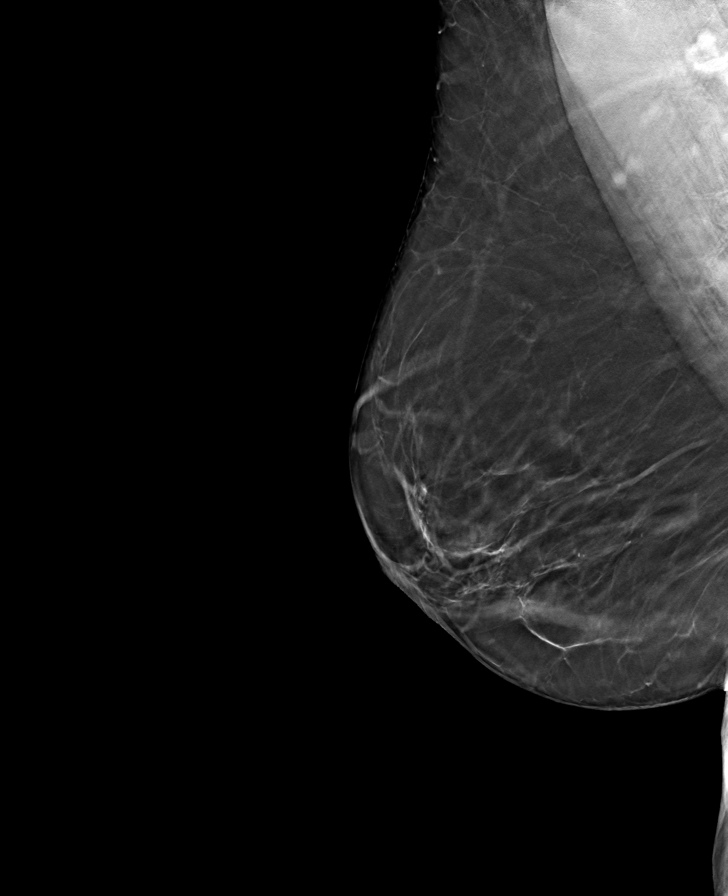

[8 of 24 positions shown; findings below may reference images not displayed]

ACR Breast Density Category b: There are scattered areas of
fibroglandular density.
FINDINGS: There are no findings suspicious for malignancy.
IMPRESSION: No mammographic evidence of malignancy. A result letter of this
screening mammogram will be mailed directly to the patient.

RECOMMENDATION:
Screening mammogram in one year. (Code:51-O-LD2)

BI-RADS CATEGORY  1: Negative.

## 2023-11-14 ENCOUNTER — Other Ambulatory Visit: Payer: Self-pay | Admitting: Obstetrics & Gynecology

## 2023-11-14 DIAGNOSIS — Z1231 Encounter for screening mammogram for malignant neoplasm of breast: Secondary | ICD-10-CM

## 2023-12-23 ENCOUNTER — Ambulatory Visit

## 2023-12-23 ENCOUNTER — Ambulatory Visit
Admission: RE | Admit: 2023-12-23 | Discharge: 2023-12-23 | Disposition: A | Discharge status: Home / Self Care | Source: Ambulatory Visit | Attending: Obstetrics & Gynecology | Admitting: Obstetrics & Gynecology

## 2023-12-23 DIAGNOSIS — Z1231 Encounter for screening mammogram for malignant neoplasm of breast: Secondary | ICD-10-CM

## 2024-03-09 ENCOUNTER — Telehealth: Payer: Self-pay | Admitting: Cardiology

## 2024-03-09 MED ORDER — SPIRONOLACTONE 25 MG PO TABS: 25.0000 mg | ORAL_TABLET | Freq: Every day | ORAL | 0 refills | Status: DC

## 2024-03-09 NOTE — Telephone Encounter (Signed)
" °*  STAT* If patient is at the pharmacy, call can be transferred to refill team.   1. Which medications need to be refilled? (please list name of each medication and dose if known)   spironolactone  (ALDACTONE ) 25 MG tablet    2. Would you like to learn more about the convenience, safety, & potential cost savings by using the [facility]? no   3. Are you open to using the [facility] (Type [facility]. no   4. Which pharmacy/location (including street and city if local pharmacy) is medication to be sent to?  [id number] - [place], [place] - [address]     5. Do they need a 30 day or 90 day supply? 30   Pt Completely out.   "

## 2024-03-09 NOTE — Telephone Encounter (Signed)
 RX sent in.

## 2024-03-13 DIAGNOSIS — M1990 Unspecified osteoarthritis, unspecified site: Secondary | ICD-10-CM | POA: Insufficient documentation

## 2024-03-13 DIAGNOSIS — G4733 Obstructive sleep apnea (adult) (pediatric): Secondary | ICD-10-CM | POA: Insufficient documentation

## 2024-03-16 ENCOUNTER — Ambulatory Visit: Admitting: Cardiology

## 2024-03-29 NOTE — Progress Notes (Signed)
" °  Cardiology Office Note:   Date:  03/30/2024  ID:  SHELBY PELTZ, DOB [birth year], MRN [id number] PCP: Sun, Vyvyan, MD  [facility] Providers Cardiologist:  Lynwood Schilling, MD {  History of Present Illness:   Isabel Wong is a 74 y.o. female who is referred by Sun, Vyvyan, MD for evaluation of difficult to control BPs.  Since I last saw her she had a coronary calcium score that was zero.     Since I last saw her she has done OK.   She is doing well with no cardiovascular symptoms.   The patient denies any new symptoms such as chest discomfort, neck or arm discomfort. There has been no new shortness of breath, PND or orthopnea. There have been no reported palpitations, presyncope or syncope.   She takes care of her 30-1/2-year-old grandchild in her house part of the time while her daughter is working.  They have a 16-year-old grandchild in [place].  She is not doing as much aerobic exercise as I would like but she has a lot of life chores and climbs stairs.  ROS: As stated in the HPI and negative for all other systems.  Studies Reviewed:    EKG:   EKG Interpretation Date/Time:  Friday March 30 2024 10:46:36 EST Ventricular Rate:  97 PR Interval:  154 QRS Duration:  80 QT Interval:  354 QTC Calculation: 449 R Axis:   74  Text Interpretation: Normal sinus rhythm Normal ECG When compared with ECG of 10-Feb-2023 16:51, No significant change was found Confirmed by Schilling Rattan ([id number]) on 03/30/2024 10:53:42 AM    Risk Assessment/Calculations:              Physical Exam:   VS:  BP 124/82 (BP Location: Left Arm, Patient Position: Sitting, Cuff Size: Normal)   Pulse 95   Ht 5' 1.5 (1.562 m)   Wt 178 lb 6.4 oz (80.9 kg)   SpO2 96%   BMI 33.16 kg/m    Wt Readings from Last 3 Encounters:  03/30/24 178 lb 6.4 oz (80.9 kg)  02/10/23 160 lb (72.6 kg)  11/26/21 170 lb 12.8 oz (77.5 kg)     GEN: Well nourished, well developed in no acute distress NECK: No JVD; No carotid  bruits CARDIAC: RRR, no murmurs, rubs, gallops RESPIRATORY:  Clear to auscultation without rales, wheezing or rhonchi  ABDOMEN: Soft, non-tender, non-distended EXTREMITIES:  No edema; No deformity   ASSESSMENT AND PLAN:   HTN:  The blood pressure is at target.  No change in therapy.   DYSLIPIDEMIA: Her LDL 120 in December 2024.  I would like to repeat a lipid profile.  She is also overdue for routine labs to include CBC TSH and c-Met.  I will check these as well.  Follow up in 1 year or sooner if needed.  Signed, Lynwood Schilling, MD   "

## 2024-03-30 ENCOUNTER — Ambulatory Visit: Attending: Cardiology | Admitting: Cardiology

## 2024-03-30 ENCOUNTER — Encounter: Payer: Self-pay | Admitting: Cardiology

## 2024-03-30 VITALS — BP 124/82 | HR 95 | Ht 61.5 in | Wt 178.4 lb

## 2024-03-30 DIAGNOSIS — E785 Hyperlipidemia, unspecified: Secondary | ICD-10-CM | POA: Diagnosis present

## 2024-03-30 DIAGNOSIS — R002 Palpitations: Secondary | ICD-10-CM | POA: Insufficient documentation

## 2024-03-30 DIAGNOSIS — I10 Essential (primary) hypertension: Secondary | ICD-10-CM | POA: Insufficient documentation

## 2024-03-30 LAB — CBC
Hematocrit: 42.6 % (ref 34.0–46.6)
Hemoglobin: 14 g/dL (ref 11.1–15.9)
MCH: 30.4 pg (ref 26.6–33.0)
MCHC: 32.9 g/dL (ref 31.5–35.7)
MCV: 92 fL (ref 79–97)
Platelets: 199 10*3/uL (ref 150–450)
RBC: 4.61 x10E6/uL (ref 3.77–5.28)
RDW: 12.6 % (ref 11.7–15.4)
WBC: 6.5 10*3/uL (ref 3.4–10.8)

## 2024-03-30 LAB — LIPID PANEL
Chol/HDL Ratio: 3.5 ratio (ref 0.0–4.4)
Cholesterol, Total: 204 mg/dL — ABNORMAL HIGH (ref 100–199)
HDL: 58 mg/dL
LDL Chol Calc (NIH): 135 mg/dL — ABNORMAL HIGH (ref 0–99)
Triglycerides: 58 mg/dL (ref 0–149)
VLDL Cholesterol Cal: 11 mg/dL (ref 5–40)

## 2024-03-30 LAB — COMPREHENSIVE METABOLIC PANEL WITH GFR
ALT: 18 [IU]/L (ref 0–32)
AST: 20 [IU]/L (ref 0–40)
Albumin: 4.6 g/dL (ref 3.8–4.8)
Alkaline Phosphatase: 70 [IU]/L (ref 49–135)
BUN/Creatinine Ratio: 15 (ref 12–28)
BUN: 12 mg/dL (ref 8–27)
Bilirubin Total: 0.4 mg/dL (ref 0.0–1.2)
CO2: 20 mmol/L (ref 20–29)
Calcium: 9.8 mg/dL (ref 8.7–10.3)
Chloride: 103 mmol/L (ref 96–106)
Creatinine, Ser: 0.79 mg/dL (ref 0.57–1.00)
Globulin, Total: 2.5 g/dL (ref 1.5–4.5)
Glucose: 91 mg/dL (ref 70–99)
Potassium: 4.6 mmol/L (ref 3.5–5.2)
Sodium: 137 mmol/L (ref 134–144)
Total Protein: 7.1 g/dL (ref 6.0–8.5)
eGFR: 79 mL/min/{1.73_m2}

## 2024-03-30 LAB — TSH: TSH: 3.68 u[IU]/mL (ref 0.450–4.500)

## 2024-03-30 NOTE — Patient Instructions (Signed)
 Medication Instructions:  Your physician recommends that you continue on your current medications as directed. Please refer to the Current Medication list given to you today.  *If you need a refill on your cardiac medications before your next appointment, please call your pharmacy*  Lab Work: Fasting lipid panel, CBC, CMET, TSH at [facility] If you have labs (blood work) drawn today and your tests are completely normal, you will receive your results only by: MyChart Message (if you have MyChart) OR A paper copy in the mail If you have any lab test that is abnormal or we need to change your treatment, we will call you to review the results.  Testing/Procedures: NONE  Follow-Up: At [facility], you and your health needs are our priority.  As part of our continuing mission to provide you with exceptional heart care, our providers are all part of one team.  This team includes your primary Cardiologist (physician) and Advanced Practice Providers or APPs (Physician Assistants and Nurse Practitioners) who all work together to provide you with the care you need, when you need it.  Your next appointment:   1 year(s)  Provider:   Lynwood Schilling, MD    We recommend signing up for the patient portal called MyChart.  Sign up information is provided on this After Visit Summary.  MyChart is used to connect with patients for Virtual Visits (Telemedicine).  Patients are able to view lab/test results, encounter notes, upcoming appointments, etc.  Non-urgent messages can be sent to your provider as well.   To learn more about what you can do with MyChart, go to [email or web address].

## 2024-04-02 ENCOUNTER — Ambulatory Visit: Payer: Self-pay | Admitting: Cardiology

## 2024-04-04 ENCOUNTER — Telehealth: Payer: Self-pay | Admitting: Cardiology

## 2024-04-04 MED ORDER — SPIRONOLACTONE 25 MG PO TABS: 25.0000 mg | ORAL_TABLET | Freq: Every day | ORAL | 3 refills | Status: AC

## 2024-04-04 NOTE — Telephone Encounter (Signed)
" °*  STAT* If patient is at the pharmacy, call can be transferred to refill team.   1. Which medications need to be refilled? (please list name of each medication and dose if known)   spironolactone  (ALDACTONE ) 25 MG tablet   2. Would you like to learn more about the convenience, safety, & potential cost savings by using the [facility]?   3. Are you open to using the [facility] (Type [facility]. ).   4. Which pharmacy/location (including street and city if local pharmacy) is medication to be sent to?  [id number] - [place], [place] - [address]   5. Do they need a 30 day or 90 day supply?   Patient stated she is almost out of this medication.  Patient had appointment with Dr. Lavona on 03/30/24. "

## 2024-04-04 NOTE — Telephone Encounter (Signed)
 Patient called to report to Dr. Lavona that her visit to [facility] had a quick turnaround, the person who waited on her was very personable and knowledgeable and very skilled.  Patient stated overall the experience was good.  Patient noted the patient sign-in equipment was not functioning which interfered with the patient sign in process.  Patient stated she did mention to the staff the equipment was not working and the staff did take steps to fix the equipment.

## 2024-04-04 NOTE — Telephone Encounter (Signed)
 Refill sent
